# Patient Record
Sex: Female | Born: 1990 | Race: Asian | Hispanic: No | Marital: Single | Smoking: Never smoker
Health system: Southern US, Community
[De-identification: ages and names within clinical notes are randomized; demographics above are authoritative.]

## PROBLEM LIST (undated history)

## (undated) ENCOUNTER — Inpatient Hospital Stay (HOSPITAL_COMMUNITY): Payer: Self-pay

---
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
## 2010-09-09 ENCOUNTER — Encounter
Admission: RE | Admit: 2010-09-09 | Discharge: 2010-09-09 | Payer: Self-pay | Source: Home / Self Care | Attending: Family Medicine | Admitting: Family Medicine

## 2011-02-05 ENCOUNTER — Other Ambulatory Visit (HOSPITAL_COMMUNITY): Payer: Self-pay | Admitting: Obstetrics & Gynecology

## 2011-02-05 ENCOUNTER — Inpatient Hospital Stay (HOSPITAL_COMMUNITY)
Admission: AD | Admit: 2011-02-05 | Discharge: 2011-02-05 | Disposition: A | Discharge status: Home / Self Care | Payer: Self-pay | Source: Ambulatory Visit | Attending: Family Medicine | Admitting: Family Medicine

## 2011-02-05 ENCOUNTER — Ambulatory Visit (HOSPITAL_COMMUNITY)
Admission: RE | Admit: 2011-02-05 | Discharge: 2011-02-05 | Disposition: A | Discharge status: Home / Self Care | Payer: Self-pay | Source: Ambulatory Visit | Attending: Obstetrics & Gynecology | Admitting: Obstetrics & Gynecology

## 2011-02-05 ENCOUNTER — Encounter (HOSPITAL_COMMUNITY): Payer: Self-pay

## 2011-02-05 DIAGNOSIS — R58 Hemorrhage, not elsewhere classified: Secondary | ICD-10-CM

## 2011-02-05 DIAGNOSIS — Z3689 Encounter for other specified antenatal screening: Secondary | ICD-10-CM | POA: Insufficient documentation

## 2011-02-05 DIAGNOSIS — O209 Hemorrhage in early pregnancy, unspecified: Secondary | ICD-10-CM

## 2011-02-05 LAB — CBC
MCH: 30.7 pg (ref 26.0–34.0)
MCHC: 32.7 g/dL (ref 30.0–36.0)
MCV: 93.8 fL (ref 78.0–100.0)
Platelets: 265 10*3/uL (ref 150–400)
RDW: 13 % (ref 11.5–15.5)
WBC: 6.4 10*3/uL (ref 4.0–10.5)

## 2011-02-05 LAB — WET PREP, GENITAL: Trich, Wet Prep: NONE SEEN

## 2011-02-06 LAB — GC/CHLAMYDIA PROBE AMP, GENITAL
Chlamydia, DNA Probe: NEGATIVE
GC Probe Amp, Genital: NEGATIVE

## 2011-02-08 ENCOUNTER — Inpatient Hospital Stay (HOSPITAL_COMMUNITY)
Admission: AD | Admit: 2011-02-08 | Discharge: 2011-02-08 | Disposition: A | Discharge status: Home / Self Care | Payer: Self-pay | Source: Ambulatory Visit | Attending: Obstetrics & Gynecology | Admitting: Obstetrics & Gynecology

## 2011-02-08 DIAGNOSIS — O039 Complete or unspecified spontaneous abortion without complication: Secondary | ICD-10-CM

## 2011-02-08 LAB — ABO/RH: ABO/RH(D): O POS

## 2011-02-08 LAB — HCG, QUANTITATIVE, PREGNANCY: hCG, Beta Chain, Quant, S: 500 m[IU]/mL — ABNORMAL HIGH (ref <5)

## 2011-02-19 DEATH — deceased

## 2011-03-03 ENCOUNTER — Encounter: Payer: Self-pay | Admitting: Obstetrics and Gynecology

## 2012-06-10 ENCOUNTER — Ambulatory Visit (INDEPENDENT_AMBULATORY_CARE_PROVIDER_SITE_OTHER): Payer: BC Managed Care – PPO | Admitting: Emergency Medicine

## 2012-06-10 ENCOUNTER — Ambulatory Visit: Payer: BC Managed Care – PPO

## 2012-06-10 VITALS — BP 100/72 | HR 78 | Temp 98.5°F | Resp 16 | Ht 60.75 in | Wt 130.2 lb

## 2012-06-10 DIAGNOSIS — Z043 Encounter for examination and observation following other accident: Secondary | ICD-10-CM

## 2012-06-10 DIAGNOSIS — M25512 Pain in left shoulder: Secondary | ICD-10-CM

## 2012-06-10 DIAGNOSIS — T07XXXA Unspecified multiple injuries, initial encounter: Secondary | ICD-10-CM

## 2012-06-10 DIAGNOSIS — R0789 Other chest pain: Secondary | ICD-10-CM

## 2012-06-10 DIAGNOSIS — M542 Cervicalgia: Secondary | ICD-10-CM

## 2012-06-10 DIAGNOSIS — M25561 Pain in right knee: Secondary | ICD-10-CM

## 2012-06-10 DIAGNOSIS — M25562 Pain in left knee: Secondary | ICD-10-CM

## 2012-06-10 MED ORDER — MELOXICAM 7.5 MG PO TABS: 7.5000 mg | ORAL_TABLET | Freq: Every day | ORAL | Status: DC

## 2012-06-10 MED ORDER — METHOCARBAMOL 750 MG PO TABS: 750.0000 mg | ORAL_TABLET | Freq: Three times a day (TID) | ORAL | Status: DC

## 2012-06-10 NOTE — Progress Notes (Deleted)
  Subjective:    Patient ID: Kelsey Lara, female    DOB: December 17, 1990, 21 y.o.   MRN: [id number]  HPI    Review of Systems     Objective:   Physical Exam        Assessment & Plan:

## 2012-06-10 NOTE — Patient Instructions (Addendum)
Cervical Strain Care After A cervical strain is when the muscles and ligaments in your neck have been stretched. The bones are not broken. If you had any problems moving your arms or legs immediately after the injury, even if the problem has gone away, make sure to tell this to your caregiver.  HOME CARE INSTRUCTIONS   While awake, apply ice packs to the neck or areas of pain about every 1 to 2 hours, for 15 to 20 minutes at a time. Do this for 2 days. If you were given a cervical collar for support, ask your caregiver if you may remove it for bathing or applying ice.   If given a cervical collar, wear as instructed. Do not remove any collar unless instructed by a caregiver.   Only take over-the-counter or prescription medicines for pain, discomfort, or fever as directed by your caregiver.  Recheck with the hospital or clinic after a radiologist has read your X-rays. Recheck with the hospital or clinic to make sure the initial readings are correct. Do this also to determine if you need further studies. It is your responsibility to find out your X-ray results. X-rays are sometimes repeated in one week to ten days. These are often repeated to make sure that a hairline fracture was not overlooked. Ask your caregiver how you are to find out about your radiology (X-ray) results. SEEK IMMEDIATE MEDICAL CARE IF:   You have increasing pain in your neck.   You develop difficulties swallowing or breathing.   You have numbness, weakness, or movement problems in the arms or legs.   You have difficulty walking.   You develop bowel or bladder retention or incontinence.   You have problems with walking.  MAKE SURE YOU:   Understand these instructions.   Will watch your condition.   Will get help right away if you are not doing well or get worse.  Document Released: 09/06/2005 Document Revised: 05/19/2011 Document Reviewed: 04/19/2008 Exit[employer] Patient Information 2012 [employer]. 
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
## 2012-06-10 NOTE — Progress Notes (Signed)
  Subjective:    Patient ID: Kelsey Lara, female    DOB: 12/18/1990, 21 y.o.   MRN: [id number]  HPI    Review of SystemsNot pregnant. On last day of menses.     Objective:   Physical Exam  Constitutional: She is oriented to person, place, and time. She appears well-developed.  HENT:  Head: Normocephalic and atraumatic.  Eyes: Pupils are equal, round, and reactive to light.       Right eye is raised slightly higher then left  Neck:       Tender l paracervical  Muscles.Tender left suprascapular muscle.  Cardiovascular: Normal rate.   Pulmonary/Chest: Effort normal and breath sounds normal.  Abdominal: Soft. There is no tenderness. There is no guarding.  Musculoskeletal:       Pain with left shoulder external rotation. Bruises over both kneecaps  Neurological: She is alert and oriented to person, place, and time. She displays normal reflexes. No cranial nerve deficit. She exhibits normal muscle tone.  Skin: Skin is warm and dry.  Psychiatric: She has a normal mood and affect. Her behavior is normal. Thought content normal.  C spine normal CXR scoliosis Knees No fracture L Shoulder normal     Assessment & Plan:  Robaxin 750 Mobic. Recheck one week if not better

## 2012-06-13 ENCOUNTER — Encounter (HOSPITAL_COMMUNITY): Payer: Self-pay | Admitting: *Deleted

## 2012-06-13 ENCOUNTER — Emergency Department (HOSPITAL_COMMUNITY)
Admission: EM | Admit: 2012-06-13 | Discharge: 2012-06-13 | Disposition: A | Discharge status: Home / Self Care | Payer: No Typology Code available for payment source | Attending: Emergency Medicine | Admitting: Emergency Medicine

## 2012-06-13 ENCOUNTER — Emergency Department (HOSPITAL_COMMUNITY): Payer: No Typology Code available for payment source

## 2012-06-13 DIAGNOSIS — M7918 Myalgia, other site: Secondary | ICD-10-CM

## 2012-06-13 DIAGNOSIS — IMO0001 Reserved for inherently not codable concepts without codable children: Secondary | ICD-10-CM | POA: Insufficient documentation

## 2012-06-13 MED ORDER — TRAMADOL HCL 50 MG PO TABS: 50.0000 mg | ORAL_TABLET | Freq: Four times a day (QID) | ORAL | Status: DC | PRN
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
## 2012-06-14 NOTE — ED Provider Notes (Signed)
Medical screening examination/treatment/procedure(s) were performed by non-physician practitioner and as supervising physician I was immediately available for consultation/collaboration.   Celene Kras, MD 06/14/12 [phone]

## 2012-07-04 ENCOUNTER — Telehealth: Payer: Self-pay

## 2012-07-04 NOTE — Telephone Encounter (Signed)
Pt is calling for a copy of xrays of neck and shoulder done on 06/10/12 Please call pt when they are ready

## 2012-07-05 NOTE — Telephone Encounter (Signed)
Have given to xray they will call patient when ready.

## 2013-07-20 ENCOUNTER — Inpatient Hospital Stay (HOSPITAL_COMMUNITY)
Admission: AD | Admit: 2013-07-20 | Discharge: 2013-07-20 | Disposition: A | Discharge status: Home / Self Care | Payer: No Typology Code available for payment source | Source: Ambulatory Visit | Attending: Obstetrics & Gynecology | Admitting: Obstetrics & Gynecology

## 2013-07-20 ENCOUNTER — Encounter (HOSPITAL_COMMUNITY): Payer: Self-pay | Admitting: *Deleted

## 2013-07-20 ENCOUNTER — Inpatient Hospital Stay (HOSPITAL_COMMUNITY): Payer: Self-pay

## 2013-07-20 ENCOUNTER — Inpatient Hospital Stay (HOSPITAL_COMMUNITY): Payer: Medicaid Other

## 2013-07-20 DIAGNOSIS — Z3490 Encounter for supervision of normal pregnancy, unspecified, unspecified trimester: Secondary | ICD-10-CM

## 2013-07-20 DIAGNOSIS — O209 Hemorrhage in early pregnancy, unspecified: Secondary | ICD-10-CM

## 2013-07-20 LAB — URINE MICROSCOPIC-ADD ON

## 2013-07-20 LAB — CBC WITH DIFFERENTIAL/PLATELET
Basophils Absolute: 0 10*3/uL (ref 0.0–0.1)
Basophils Relative: 1 % (ref 0–1)
Eosinophils Absolute: 0.1 10*3/uL (ref 0.0–0.7)
Lymphs Abs: 1.3 10*3/uL (ref 0.7–4.0)
MCH: 30.9 pg (ref 26.0–34.0)
MCHC: 33.7 g/dL (ref 30.0–36.0)
Neutro Abs: 5.8 10*3/uL (ref 1.7–7.7)
Neutrophils Relative %: 74 % (ref 43–77)
Platelets: 243 10*3/uL (ref 150–400)
RDW: 12.9 % (ref 11.5–15.5)
WBC: 7.8 10*3/uL (ref 4.0–10.5)

## 2013-07-20 LAB — URINALYSIS, ROUTINE W REFLEX MICROSCOPIC
Ketones, ur: NEGATIVE mg/dL
Leukocytes, UA: NEGATIVE
Nitrite: NEGATIVE
Protein, ur: NEGATIVE mg/dL
Urobilinogen, UA: 0.2 mg/dL (ref 0.0–1.0)

## 2013-07-20 LAB — WET PREP, GENITAL
Trich, Wet Prep: NONE SEEN
Yeast Wet Prep HPF POC: NONE SEEN

## 2013-07-20 LAB — POCT PREGNANCY, URINE: Preg Test, Ur: POSITIVE — AB

## 2013-07-20 NOTE — MAU Provider Note (Signed)
History     CSN: [id number]  Arrival date and time: 07/20/13 1235   First Provider Initiated Contact with Patient 07/20/13 1310      Chief Complaint  Patient presents with  . Vaginal Bleeding   HPI Kelsey Lara is 22 y.o. G4P1011 [redacted]w[redacted]d weeks presenting with vaginal bleeding that began last night.  Described as bright red, spotty X 30 minutes.  No further bleeding today but saw brown discharge on tissue.  Denies cramping.  Denies nausea and vomiting. Denies UTI sxs.   Has not decided where she will get prenatal care.  Last intercourse 2 days ago.     History reviewed. No pertinent past medical history.  History reviewed. No pertinent past surgical history.  History reviewed. No pertinent family history.  History  Substance Use Topics  . Smoking status: Never Smoker   . Smokeless tobacco: Never Used  . Alcohol Use: No    Allergies: No Known Allergies  No prescriptions prior to admission    Review of Systems  Constitutional: Negative for fever and chills.  Gastrointestinal: Negative for nausea, vomiting, abdominal pain, diarrhea and constipation.  Genitourinary: Negative for dysuria, urgency and frequency.       + for vaginal bleeding  Neurological: Negative for headaches.   Physical Exam   Blood pressure 105/59, pulse 89, temperature 98.2 F (36.8 C), temperature source Oral, resp. rate 16, height 5\' 1"  (1.549 m), weight 125 lb (56.7 kg), last menstrual period 06/06/2013, SpO2 100.00%.  Physical Exam  Constitutional: She is oriented to person, place, and time. She appears well-developed and well-nourished. No distress.  HENT:  Head: Normocephalic.  Neck: Normal range of motion.  Cardiovascular: Normal rate.   Respiratory: Effort normal.  GI: Soft. She exhibits no distension and no mass. There is no tenderness. There is no rebound and no guarding.  Genitourinary: There is no rash, tenderness or lesion on the right labia. There is no rash, tenderness or lesion on the  left labia. Uterus is enlarged. Uterus is not tender. Cervix exhibits no motion tenderness and no friability. Right adnexum displays no mass, no tenderness and no fullness. Left adnexum displays no mass, no tenderness and no fullness. No bleeding (neg for active bleeding) around the vagina. Vaginal discharge (dark brown discharge-mod amount) found.  Neurological: She is alert and oriented to person, place, and time.  Skin: Skin is warm and dry.  Psychiatric: She has a normal mood and affect. Her behavior is normal.   Results for orders placed during the hospital encounter of 07/20/13 (from the past 24 hour(s))  URINALYSIS, ROUTINE W REFLEX MICROSCOPIC     Status: Abnormal   Collection Time    07/20/13 12:42 PM      Result Value Range   Color, Urine YELLOW  YELLOW   APPearance HAZY (*) CLEAR   Specific Gravity, Urine 1.020  1.005 - 1.030   pH 7.5  5.0 - 8.0   Glucose, UA NEGATIVE  NEGATIVE mg/dL   Hgb urine dipstick SMALL (*) NEGATIVE   Bilirubin Urine NEGATIVE  NEGATIVE   Ketones, ur NEGATIVE  NEGATIVE mg/dL   Protein, ur NEGATIVE  NEGATIVE mg/dL   Urobilinogen, UA 0.2  0.0 - 1.0 mg/dL   Nitrite NEGATIVE  NEGATIVE   Leukocytes, UA NEGATIVE  NEGATIVE  URINE MICROSCOPIC-ADD ON     Status: Abnormal   Collection Time    07/20/13 12:42 PM      Result Value Range   Squamous Epithelial / LPF RARE  RARE  WBC, UA 0-2  <3 WBC/hpf   RBC / HPF 0-2  <3 RBC/hpf   Bacteria, UA FEW (*) RARE   Urine-Other AMORPHOUS URATES/PHOSPHATES    POCT PREGNANCY, URINE     Status: Abnormal   Collection Time    07/20/13  1:03 PM      Result Value Range   Preg Test, Ur POSITIVE (*) NEGATIVE  CBC WITH DIFFERENTIAL     Status: None   Collection Time    07/20/13  1:23 PM      Result Value Range   WBC 7.8  4.0 - 10.5 K/uL   RBC 4.05  3.87 - 5.11 MIL/uL   Hemoglobin 12.5  12.0 - 15.0 g/dL   HCT 16.1  09.6 - 04.5 %   MCV 91.6  78.0 - 100.0 fL   MCH 30.9  26.0 - 34.0 pg   MCHC 33.7  30.0 - 36.0 g/dL   RDW  40.9  81.1 - 91.4 %   Platelets 243  150 - 400 K/uL   Neutrophils Relative % 74  43 - 77 %   Neutro Abs 5.8  1.7 - 7.7 K/uL   Lymphocytes Relative 17  12 - 46 %   Lymphs Abs 1.3  0.7 - 4.0 K/uL   Monocytes Relative 7  3 - 12 %   Monocytes Absolute 0.6  0.1 - 1.0 K/uL   Eosinophils Relative 1  0 - 5 %   Eosinophils Absolute 0.1  0.0 - 0.7 K/uL   Basophils Relative 1  0 - 1 %   Basophils Absolute 0.0  0.0 - 0.1 K/uL  HCG, QUANTITATIVE, PREGNANCY     Status: Abnormal   Collection Time    07/20/13  1:23 PM      Result Value Range   hCG, Beta Chain, Quant, S [id number] (*) <5 mIU/mL  WET PREP, GENITAL     Status: Abnormal   Collection Time    07/20/13  1:30 PM      Result Value Range   Yeast Wet Prep HPF POC NONE SEEN  NONE SEEN   Trich, Wet Prep NONE SEEN  NONE SEEN   Clue Cells Wet Prep HPF POC NONE SEEN  NONE SEEN   WBC, Wet Prep HPF POC MANY (*) NONE SEEN    US Ob Comp Less 14 Wks  07/20/2013   CLINICAL DATA:  Early pregnancy. Bleeding  EXAM: OBSTETRIC <14 WK ULTRASOUND  TECHNIQUE: Transabdominal ultrasound was performed for evaluation of the gestation as well as the maternal uterus and adnexal regions.  COMPARISON:  02/05/2011  FINDINGS: Intrauterine gestational sac: Visualized/normal in shape.  Yolk sac:  Yes  Embryo:  Yes  Cardiac Activity: Yes  Heart Rate: 126 bpm  MSD:   mm    w     d  CRL:   7.5  mm   6 w 5 d                  [place] EDC: 03/10/2014  Maternal uterus/adnexae:  Subchorionic hemorrhage: None  Right ovary: Normal  Left ovary: Normal  Other :None  Free fluid: None  IMPRESSION: 1. Single living intrauterine gestation with an estimated gestational age of [redacted] weeks and 5 days.   Electronically Signed   By: Signa Kell M.D.   On: 07/20/2013 14:36  MAU Course  Procedures  GC/CHL cultures to lab  MDM  Assessment and Plan  A:  Viable intrauterine pregnancy at [redacted]w[redacted]d gestation  Vaginal bleeding in early pregnancy  P:  Begin prenatal care with MD of choice       Pelvic rest  until bleeding stops      Return for worsening sxs  Jevon Littlepage,EVE M 07/20/2013, 3:25 PM

## 2013-07-20 NOTE — MAU Provider Note (Signed)
Attestation of Attending Supervision of Advanced Practitioner (CNM/NP): Evaluation and management procedures were performed by the Advanced Practitioner under my supervision and collaboration.  I have reviewed the Advanced Practitioner's note and chart, and I agree with the management and plan.  HARRAWAY-SMITH, Ramona Slinger 3:32 PM

## 2013-07-20 NOTE — MAU Note (Signed)
Patient presents to MAU with c/o bright red spotting yesterday; noticed brown spotting this morning. No bleeding now. Denies pain.

## 2013-07-21 LAB — GC/CHLAMYDIA PROBE AMP: CT Probe RNA: NEGATIVE

## 2013-09-20 NOTE — L&D Delivery Note (Signed)
Patient was C/C/+2 and pushed for 0 minutes with epidural.   NSVD  female infant, Apgars 8,9, weight P.   The patient had no lacerations . Fundus was firm. EBL was expected at 400 cc but methergine given for some atony. Placenta was delivered intact. Vagina was clear.  Baby was vigorous and doing skin to skin with mother.  HORVATH,MICHELLE A

## 2013-11-02 LAB — OB RESULTS CONSOLE ABO/RH: RH Type: POSITIVE

## 2013-11-02 LAB — OB RESULTS CONSOLE HEPATITIS B SURFACE ANTIGEN: Hepatitis B Surface Ag: NEGATIVE

## 2013-11-02 LAB — OB RESULTS CONSOLE GC/CHLAMYDIA
Chlamydia: NEGATIVE
Gonorrhea: NEGATIVE

## 2013-11-02 LAB — OB RESULTS CONSOLE RPR: RPR: NONREACTIVE

## 2013-11-02 LAB — OB RESULTS CONSOLE ANTIBODY SCREEN: Antibody Screen: NEGATIVE

## 2013-11-02 LAB — OB RESULTS CONSOLE HIV ANTIBODY (ROUTINE TESTING): HIV: NONREACTIVE

## 2013-11-02 LAB — OB RESULTS CONSOLE RUBELLA ANTIBODY, IGM: Rubella: IMMUNE

## 2014-02-15 LAB — OB RESULTS CONSOLE GBS: GBS: NEGATIVE

## 2014-03-14 ENCOUNTER — Telehealth (HOSPITAL_COMMUNITY): Payer: Self-pay | Admitting: *Deleted

## 2014-03-14 ENCOUNTER — Encounter (HOSPITAL_COMMUNITY): Payer: Self-pay | Admitting: *Deleted

## 2014-03-14 NOTE — Telephone Encounter (Signed)
Preadmission screen  

## 2014-03-15 ENCOUNTER — Inpatient Hospital Stay (HOSPITAL_COMMUNITY)
Admission: RE | Admit: 2014-03-15 | Discharge: 2014-03-16 | DRG: 775 | Disposition: A | Discharge status: Home / Self Care | Payer: Medicaid Other | Source: Ambulatory Visit | Attending: Obstetrics and Gynecology | Admitting: Obstetrics and Gynecology

## 2014-03-15 ENCOUNTER — Encounter (HOSPITAL_COMMUNITY): Payer: Self-pay

## 2014-03-15 DIAGNOSIS — Z3490 Encounter for supervision of normal pregnancy, unspecified, unspecified trimester: Secondary | ICD-10-CM

## 2014-03-15 DIAGNOSIS — O480 Post-term pregnancy: Principal | ICD-10-CM | POA: Diagnosis present

## 2014-03-15 LAB — CBC
HCT: 39.4 % (ref 36.0–46.0)
HEMOGLOBIN: 13.2 g/dL (ref 12.0–15.0)
MCH: 31.7 pg (ref 26.0–34.0)
MCHC: 33.5 g/dL (ref 30.0–36.0)
MCV: 94.5 fL (ref 78.0–100.0)
Platelets: 218 10*3/uL (ref 150–400)
RBC: 4.17 MIL/uL (ref 3.87–5.11)
RDW: 14.2 % (ref 11.5–15.5)
WBC: 7.7 10*3/uL (ref 4.0–10.5)

## 2014-03-15 LAB — TYPE AND SCREEN
ABO/RH(D): O POS
Antibody Screen: NEGATIVE

## 2014-03-15 LAB — RPR

## 2014-03-15 MED ORDER — PHENYLEPHRINE 40 MCG/ML (10ML) SYRINGE FOR IV PUSH (FOR BLOOD PRESSURE SUPPORT)
80.0000 ug | PREFILLED_SYRINGE | INTRAVENOUS | Status: DC | PRN
  Filled 2014-03-15: qty 2

## 2014-03-15 MED ORDER — OXYTOCIN BOLUS FROM INFUSION: 500.0000 mL | INTRAVENOUS | Status: DC

## 2014-03-15 MED ORDER — MEASLES, MUMPS & RUBELLA VAC ~~LOC~~ INJ: 0.5000 mL | INJECTION | Freq: Once | SUBCUTANEOUS | Status: DC

## 2014-03-15 MED ORDER — LACTATED RINGERS IV SOLN: 500.0000 mL | Freq: Once | INTRAVENOUS | Status: DC

## 2014-03-15 MED ORDER — FERROUS SULFATE 325 (65 FE) MG PO TABS
325.0000 mg | ORAL_TABLET | Freq: Two times a day (BID) | ORAL | Status: DC
  Administered 2014-03-16 (×2): 325 mg via ORAL
  Filled 2014-03-15 (×2): qty 1

## 2014-03-15 MED ORDER — DIPHENHYDRAMINE HCL 25 MG PO CAPS: 25.0000 mg | ORAL_CAPSULE | Freq: Four times a day (QID) | ORAL | Status: DC | PRN

## 2014-03-15 MED ORDER — METHYLERGONOVINE MALEATE 0.2 MG PO TABS: 0.2000 mg | ORAL_TABLET | ORAL | Status: DC | PRN

## 2014-03-15 MED ORDER — ACETAMINOPHEN 325 MG PO TABS: 650.0000 mg | ORAL_TABLET | ORAL | Status: DC | PRN

## 2014-03-15 MED ORDER — OXYCODONE-ACETAMINOPHEN 5-325 MG PO TABS: 1.0000 | ORAL_TABLET | ORAL | Status: DC | PRN

## 2014-03-15 MED ORDER — METHYLERGONOVINE MALEATE 0.2 MG/ML IJ SOLN: 0.2000 mg | INTRAMUSCULAR | Status: DC | PRN

## 2014-03-15 MED ORDER — ONDANSETRON HCL 4 MG/2ML IJ SOLN: 4.0000 mg | INTRAMUSCULAR | Status: DC | PRN

## 2014-03-15 MED ORDER — LACTATED RINGERS IV SOLN
INTRAVENOUS | Status: DC
  Administered 2014-03-15: 1000 mL via INTRAVENOUS

## 2014-03-15 MED ORDER — TERBUTALINE SULFATE 1 MG/ML IJ SOLN: 0.2500 mg | Freq: Once | INTRAMUSCULAR | Status: DC | PRN

## 2014-03-15 MED ORDER — METHYLERGONOVINE MALEATE 0.2 MG/ML IJ SOLN
0.2000 mg | Freq: Once | INTRAMUSCULAR | Status: AC
  Administered 2014-03-15: 0.2 mg via INTRAMUSCULAR

## 2014-03-15 MED ORDER — BENZOCAINE-MENTHOL 20-0.5 % EX AERO: 1.0000 "application " | INHALATION_SPRAY | CUTANEOUS | Status: DC | PRN

## 2014-03-15 MED ORDER — MAGNESIUM HYDROXIDE 400 MG/5ML PO SUSP: 30.0000 mL | ORAL | Status: DC | PRN

## 2014-03-15 MED ORDER — IBUPROFEN 800 MG PO TABS
800.0000 mg | ORAL_TABLET | Freq: Three times a day (TID) | ORAL | Status: DC
  Administered 2014-03-16 (×3): 800 mg via ORAL
  Filled 2014-03-15 (×3): qty 1

## 2014-03-15 MED ORDER — SODIUM CHLORIDE 0.9 % IV SOLN: 250.0000 mL | INTRAVENOUS | Status: DC | PRN

## 2014-03-15 MED ORDER — ZOLPIDEM TARTRATE 5 MG PO TABS: 5.0000 mg | ORAL_TABLET | Freq: Every evening | ORAL | Status: DC | PRN

## 2014-03-15 MED ORDER — METHYLERGONOVINE MALEATE 0.2 MG/ML IJ SOLN
INTRAMUSCULAR | Status: AC
  Filled 2014-03-15: qty 1

## 2014-03-15 MED ORDER — WITCH HAZEL-GLYCERIN EX PADS: 1.0000 "application " | MEDICATED_PAD | CUTANEOUS | Status: DC | PRN

## 2014-03-15 MED ORDER — ONDANSETRON HCL 4 MG/2ML IJ SOLN: 4.0000 mg | Freq: Four times a day (QID) | INTRAMUSCULAR | Status: DC | PRN

## 2014-03-15 MED ORDER — LANOLIN HYDROUS EX OINT: TOPICAL_OINTMENT | CUTANEOUS | Status: DC | PRN

## 2014-03-15 MED ORDER — BUTORPHANOL TARTRATE 1 MG/ML IJ SOLN
1.0000 mg | INTRAMUSCULAR | Status: DC | PRN
  Administered 2014-03-15: 1 mg via INTRAVENOUS
  Filled 2014-03-15: qty 1

## 2014-03-15 MED ORDER — EPHEDRINE 5 MG/ML INJ
10.0000 mg | INTRAVENOUS | Status: DC | PRN
  Filled 2014-03-15: qty 2

## 2014-03-15 MED ORDER — SODIUM CHLORIDE 0.9 % IJ SOLN: 3.0000 mL | Freq: Two times a day (BID) | INTRAMUSCULAR | Status: DC

## 2014-03-15 MED ORDER — SODIUM CHLORIDE 0.9 % IJ SOLN: 3.0000 mL | INTRAMUSCULAR | Status: DC | PRN

## 2014-03-15 MED ORDER — SENNOSIDES-DOCUSATE SODIUM 8.6-50 MG PO TABS
2.0000 | ORAL_TABLET | ORAL | Status: DC
  Administered 2014-03-16: 2 via ORAL
  Filled 2014-03-15: qty 2

## 2014-03-15 MED ORDER — LIDOCAINE HCL (PF) 1 % IJ SOLN
30.0000 mL | INTRAMUSCULAR | Status: DC | PRN
  Filled 2014-03-15: qty 30

## 2014-03-15 MED ORDER — OXYTOCIN 40 UNITS IN LACTATED RINGERS INFUSION - SIMPLE MED: 62.5000 mL/h | INTRAVENOUS | Status: DC

## 2014-03-15 MED ORDER — PRENATAL MULTIVITAMIN CH
1.0000 | ORAL_TABLET | Freq: Every day | ORAL | Status: DC
  Administered 2014-03-16: 1 via ORAL
  Filled 2014-03-15: qty 1

## 2014-03-15 MED ORDER — FENTANYL 2.5 MCG/ML BUPIVACAINE 1/10 % EPIDURAL INFUSION (WH - ANES): 14.0000 mL/h | INTRAMUSCULAR | Status: DC | PRN

## 2014-03-15 MED ORDER — FLEET ENEMA 7-19 GM/118ML RE ENEM: 1.0000 | ENEMA | RECTAL | Status: DC | PRN

## 2014-03-15 MED ORDER — DIPHENHYDRAMINE HCL 50 MG/ML IJ SOLN: 12.5000 mg | INTRAMUSCULAR | Status: DC | PRN

## 2014-03-15 MED ORDER — LACTATED RINGERS IV SOLN: 500.0000 mL | INTRAVENOUS | Status: DC | PRN

## 2014-03-15 MED ORDER — TETANUS-DIPHTH-ACELL PERTUSSIS 5-2.5-18.5 LF-MCG/0.5 IM SUSP
0.5000 mL | Freq: Once | INTRAMUSCULAR | Status: AC
  Administered 2014-03-16: 0.5 mL via INTRAMUSCULAR
  Filled 2014-03-15: qty 0.5

## 2014-03-15 MED ORDER — ONDANSETRON HCL 4 MG PO TABS: 4.0000 mg | ORAL_TABLET | ORAL | Status: DC | PRN

## 2014-03-15 MED ORDER — SIMETHICONE 80 MG PO CHEW: 80.0000 mg | CHEWABLE_TABLET | ORAL | Status: DC | PRN

## 2014-03-15 MED ORDER — IBUPROFEN 600 MG PO TABS: 600.0000 mg | ORAL_TABLET | Freq: Four times a day (QID) | ORAL | Status: DC | PRN

## 2014-03-15 MED ORDER — CITRIC ACID-SODIUM CITRATE 334-500 MG/5ML PO SOLN: 30.0000 mL | ORAL | Status: DC | PRN

## 2014-03-15 MED ORDER — OXYTOCIN 40 UNITS IN LACTATED RINGERS INFUSION - SIMPLE MED
1.0000 m[IU]/min | INTRAVENOUS | Status: DC
Start: 2014-03-15 — End: 2014-03-15
  Administered 2014-03-15: 2 m[IU]/min via INTRAVENOUS
  Filled 2014-03-15: qty 1000

## 2014-03-15 MED ORDER — DIBUCAINE 1 % RE OINT: 1.0000 "application " | TOPICAL_OINTMENT | RECTAL | Status: DC | PRN
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
## 2014-03-15 NOTE — Progress Notes (Signed)
This note also relates to the following rows which could not be included: BP - Cannot attach notes to unvalidated device data    Dr Henderson CloudHorvath in room to evaluate bleeding. Orders methergine.

## 2014-03-15 NOTE — H&P (Signed)
23 y.o. 7638w2d  G4P1011 comes in for post term induction.  Otherwise has good fetal movement and no bleeding.  History reviewed. No pertinent past medical history. History reviewed. No pertinent past surgical history.  OB History  Gravida Para Term Preterm AB SAB TAB Ectopic Multiple Living  4 1 1  1 1    1     # Outcome Date GA Lbr Len/2nd Weight Sex Delivery Anes PTL Lv  4 CUR           3 TRM 09/26/07 8431w0d 06:00 3.204 kg (7 lb 1 oz) F SVD   Y  2 SAB           1 GRA              Comments: System Generated. Please review and update pregnancy details.      History   Social History  . Marital Status: Single    Spouse Name: N/A    Number of Children: N/A  . Years of Education: N/A   Occupational History  . Not on file.   Social History Main Topics  . Smoking status: Never Smoker   . Smokeless tobacco: Never Used  . Alcohol Use: No  . Drug Use: No  . Sexual Activity: Yes   Other Topics Concern  . Not on file   Social History Narrative  . No narrative on file   Review of patient's allergies indicates no known allergies.    Prenatal Transfer Tool  Maternal Diabetes: No Genetic Screening: Declined- late pnc Maternal Ultrasounds/Referrals: Normal Fetal Ultrasounds or other Referrals:  None Maternal Substance Abuse:  No Significant Maternal Medications:  None Significant Maternal Lab Results: None  Other PNC: uncomplicated.    Filed Vitals:   03/15/14 0715  BP: 110/80  Pulse: 94  Temp: 98.1 F (36.7 C)  Resp: 20     Lungs/Cor:  NAD Abdomen:  soft, gravid Ex:  no cords, erythema SVE:  4/70/-2, AROM clear FHTs:  120, good STV, NST R Toco:  q10   A/P   Post term induction.  GBS neg  HORVATH,MICHELLE A

## 2014-03-16 LAB — CBC
HEMATOCRIT: 32 % — AB (ref 36.0–46.0)
Hemoglobin: 10.6 g/dL — ABNORMAL LOW (ref 12.0–15.0)
MCH: 31.4 pg (ref 26.0–34.0)
MCHC: 33.4 g/dL (ref 30.0–36.0)
MCV: 93.8 fL (ref 78.0–100.0)
Platelets: 210 10*3/uL (ref 150–400)
RBC: 3.41 MIL/uL — ABNORMAL LOW (ref 3.87–5.11)
RDW: 14 % (ref 11.5–15.5)
WBC: 13.1 10*3/uL — ABNORMAL HIGH (ref 4.0–10.5)

## 2014-03-16 NOTE — Discharge Summary (Signed)
Obstetric Discharge Summary Reason for Admission: induction of labor Prenatal Procedures: none Intrapartum Procedures: spontaneous vaginal delivery Postpartum Procedures: none Complications-Operative and Postpartum: none Hemoglobin  Date Value Ref Range Status  03/16/2014 10.6* 12.0 - 15.0 g/dL Final     REPEATED TO VERIFY     DELTA CHECK NOTED     HCT  Date Value Ref Range Status  03/16/2014 32.0* 36.0 - 46.0 % Final   Discharge Diagnoses: Term Pregnancy-delivered  Discharge Information: Date: 03/16/2014 Activity: pelvic rest Diet: routine Medications: Ibuprofen Condition: stable Instructions: refer to practice specific booklet Discharge to: home Follow-up Information   Follow up with HORVATH,MICHELLE A, MD In 4 weeks.   Specialty:  Obstetrics and Gynecology   Contact information:   [address] [place] [id number] [phone]       Newborn Data: Live born female  Birth Weight: 8 lb 5 oz (3771 g) APGAR: 8, 9  Home with mother.  HORVATH,MICHELLE A 03/16/2014, 7:36 AM

## 2014-03-16 NOTE — Progress Notes (Signed)
Patient is eating, ambulating, voiding.  Pain control is good.  Filed Vitals:   03/15/14 2030 03/15/14 2145 03/16/14 0100 03/16/14 0635  BP: 110/71 98/59 99/66  92/57  Pulse: 76 81 75 79  Temp: 98.6 F (37 C) 98.6 F (37 C) 99 F (37.2 C) 97.9 F (36.6 C)  TempSrc: Oral Oral Oral Oral  Resp: 18 18 18 17   Height:      Weight:      SpO2: 100%   96%    Fundus firm Perineum without swelling.  Lab Results  Component Value Date   WBC 13.1* 03/16/2014   HGB 10.6* 03/16/2014   HCT 32.0* 03/16/2014   MCV 93.8 03/16/2014   PLT 210 03/16/2014    --/--/O POS (06/26 0730)/RI  A/P Post partum day 1.  Routine care.  Expect d/c routine.    HORVATH,MICHELLE A

## 2014-03-16 NOTE — Lactation Note (Addendum)
This note was copied from the chart of Kelsey Meklit Spellman. Lactation Consultation Note  Patient Name: Kelsey Lara ZOXWR'UToday's Date: 03/16/2014 Reason for consult: Initial assessment Per mom the baby fed several times after birth and since has been sleepy. Lc reviewed basics, and mentioned it is normal, LC changed wet and stool diaper. Reviewed hand expressing ( noted a steady flow of colostrum both breast ) , breast massage ,  Latched well both breast with assist for depth and positioning. Multiply swallows noted , increased with breast compressions. And baby sustained a deep latch on the right breast for 20 mins, left breast deep latch and still feeding when consult finished . [facility]BU RN aware. Per mom comfortable. LC reviewed sore nipple and engorgement prevention and tx . Per mom has a pump at home and is also active with WIC.  Mother informed of post-discharge support and given phone number to the lactation department, including services for phone call assistance; out-patient appointments; and breastfeeding support group. List of other breastfeeding resources in the community given in the handout. Encouraged mother to call for problems or concerns related to breastfeeding. Mom c/o tender nipples , no breakdown noted , [facility] LC encouraged mom to use her EBM liberally to her nipples and showed her to use reverse pressure prior to latch.    Maternal Data Has patient been taught Hand Expression?: Yes Does the patient have breastfeeding experience prior to this delivery?: Yes  Feeding Feeding Type: Breast Fed (left breast , cross cradle ) Length of feed: 20 min  LATCH Score/Interventions Latch: Grasps breast easily, tongue down, lips flanged, rhythmical sucking.  Audible Swallowing: Spontaneous and intermittent (multiply swallows , increased with breast compressions )  Type of Nipple: Everted at rest and after stimulation  Comfort (Breast/Nipple): Soft /  non-tender     Hold (Positioning): Assistance needed to correctly position infant at breast and maintain latch. Intervention(s): Breastfeeding basics reviewed;Support Pillows;Position options;Skin to skin  LATCH Score: 9  Lactation Tools Discussed/Used WIC Program: Yes (per mom , active )   Consult Status Consult Status: Follow-up Date: 03/17/14 Follow-up type: In-patient    Kathrin Greathouseorio, Margaret Ann 03/16/2014, 5:52 PM

## 2014-03-18 NOTE — Progress Notes (Signed)
Post discharge chart review completed.  

## 2014-05-11 IMAGING — CR DG KNEE 1-2V*L*
2 series · 2 of 2 positions shown · non-contrast
Comparison: None.

CLINICAL DATA: Knee pain

LEFT KNEE - 1-2 VIEW

[AP]
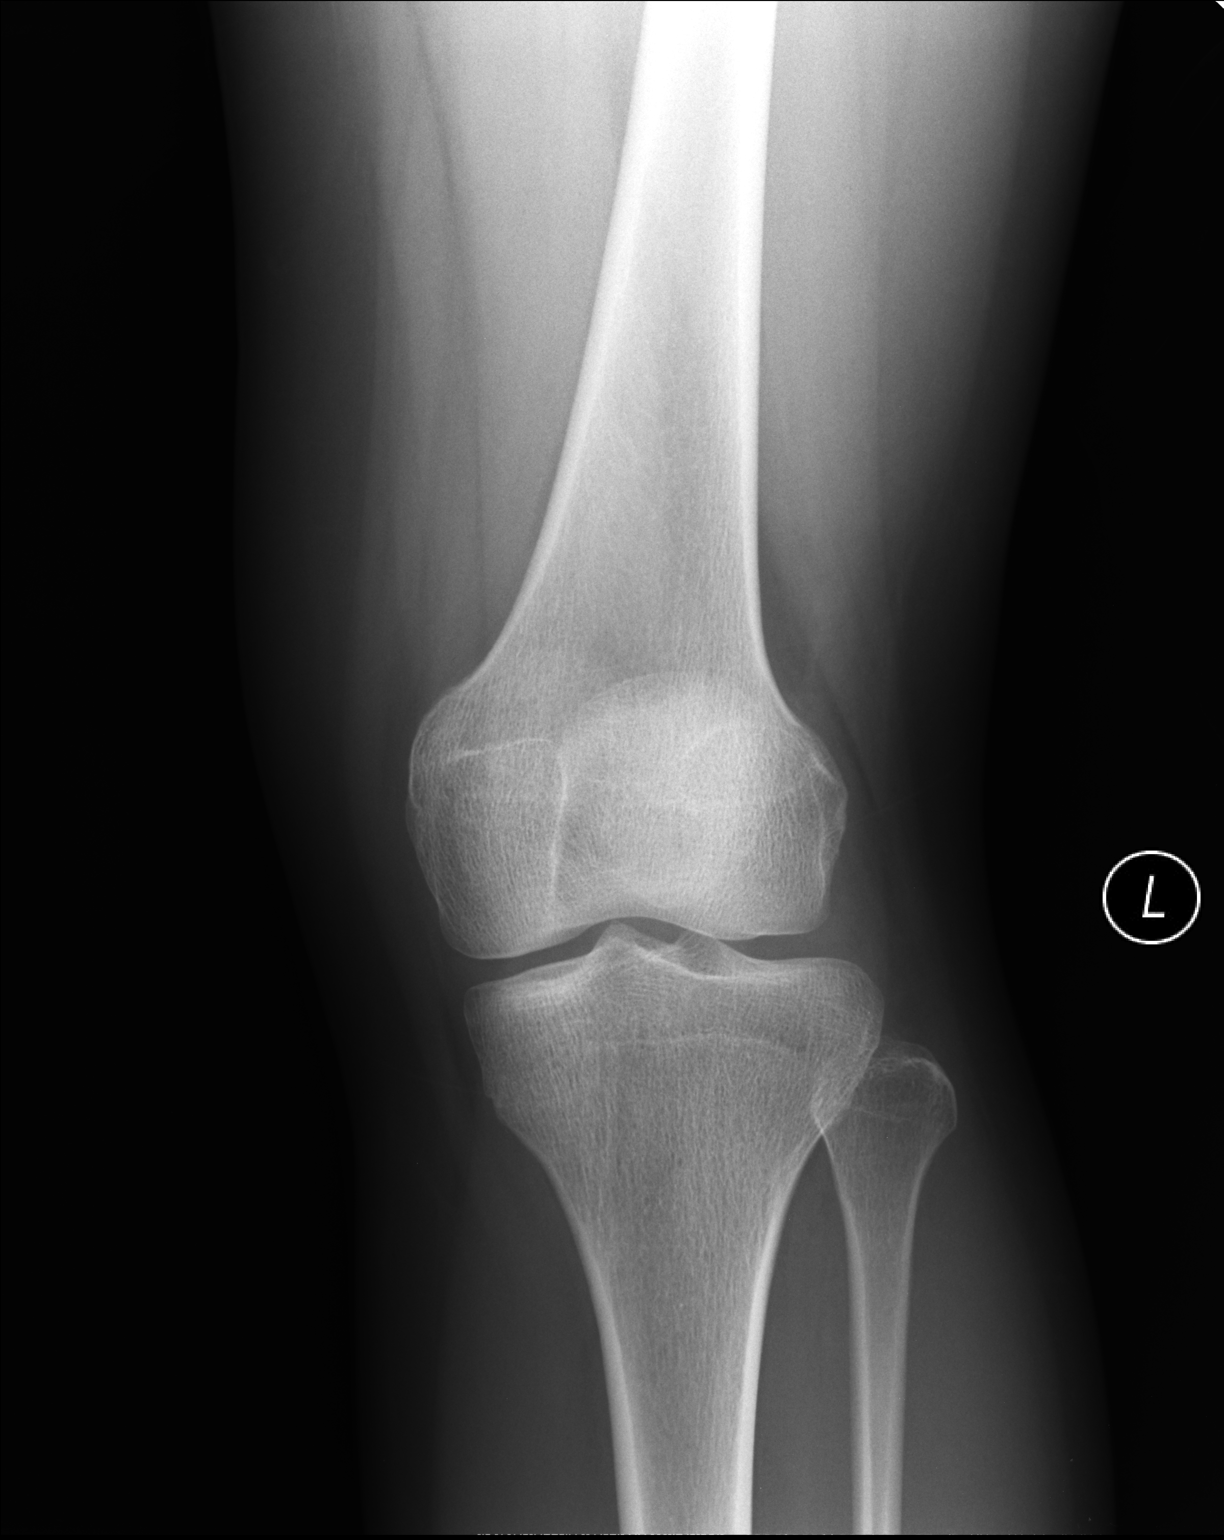

[lateral]
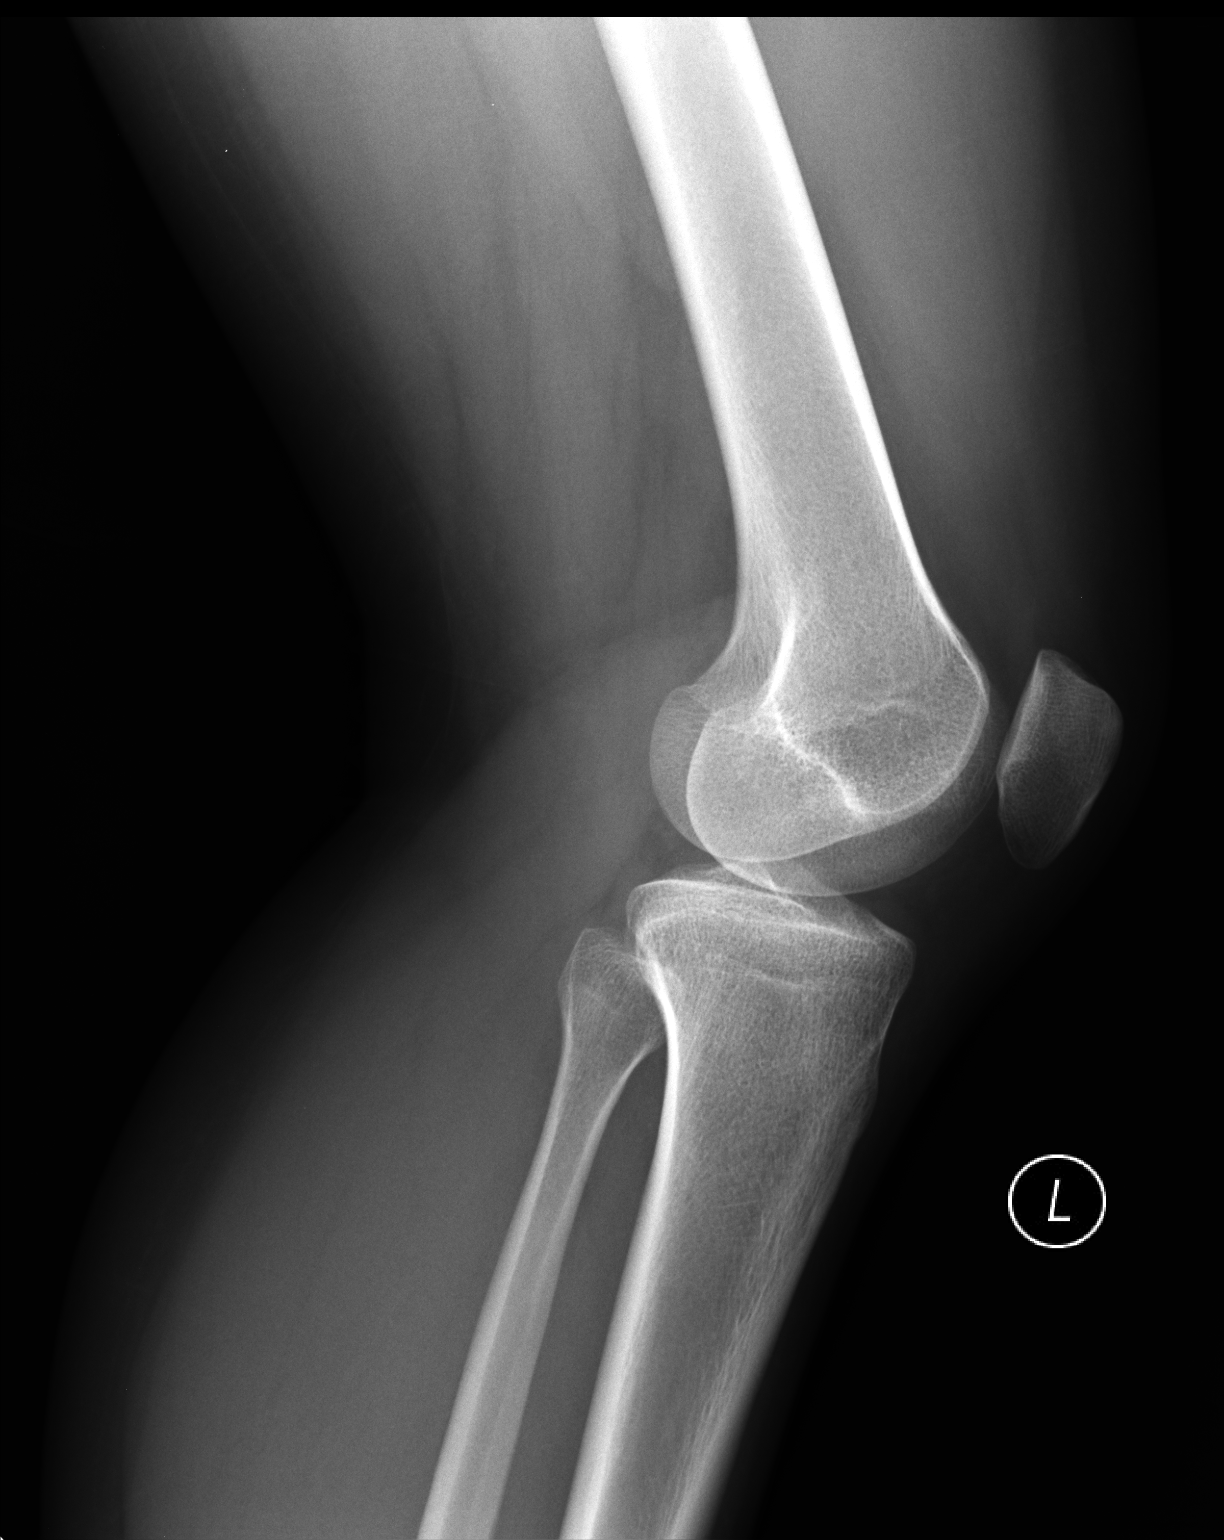

[2 of 2 positions shown; findings below may reference images not displayed]

FINDINGS: Two views of the left knee submitted.  No acute fracture
or subluxation.  No radiopaque foreign body.  No joint effusion.
IMPRESSION: No acute fracture or subluxation.
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
## 2014-07-22 ENCOUNTER — Encounter (HOSPITAL_COMMUNITY): Payer: Self-pay

## 2015-06-20 IMAGING — US US OB COMP LESS 14 WK
1 series · 14 of 28 positions shown · non-contrast
Comparison: 02/05/2011

CLINICAL DATA: Early pregnancy. Bleeding

EXAM:
OBSTETRIC <14 WK ULTRASOUND
TECHNIQUE: Transabdominal ultrasound was performed for evaluation of the
gestation as well as the maternal uterus and adnexal regions.

[Series 1: us ob comp less 14 wks · 14 of 47 slices shown]
[im 2/47]
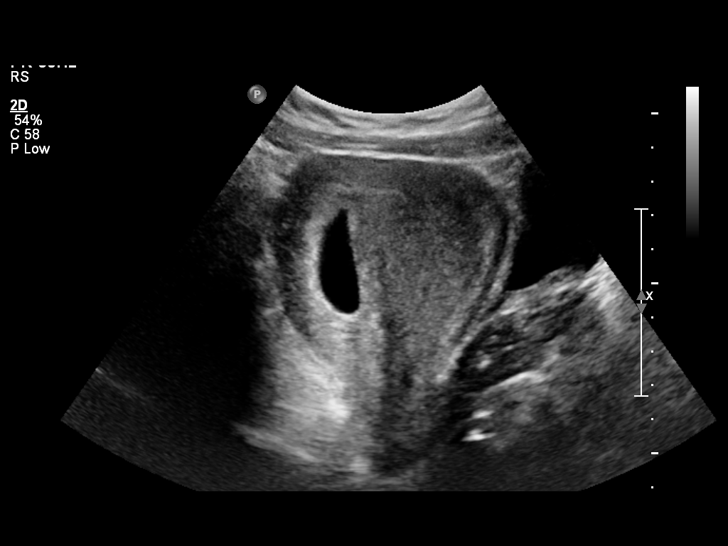
[im 6/47]
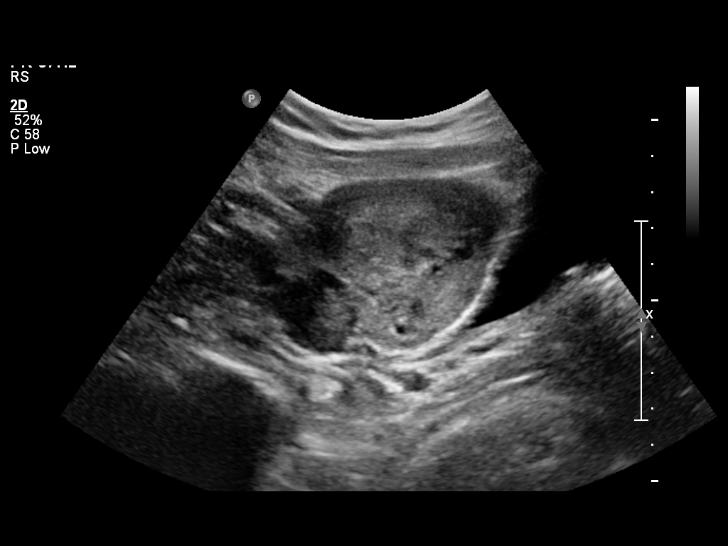
[im 9/47]
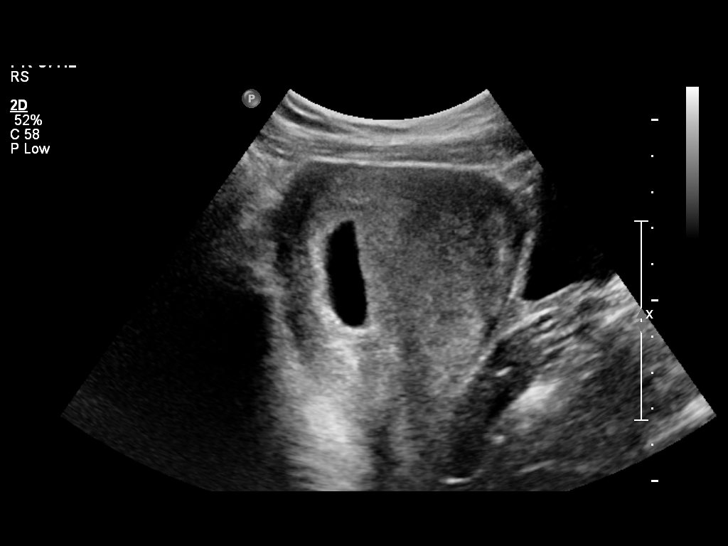
[im 12/47]
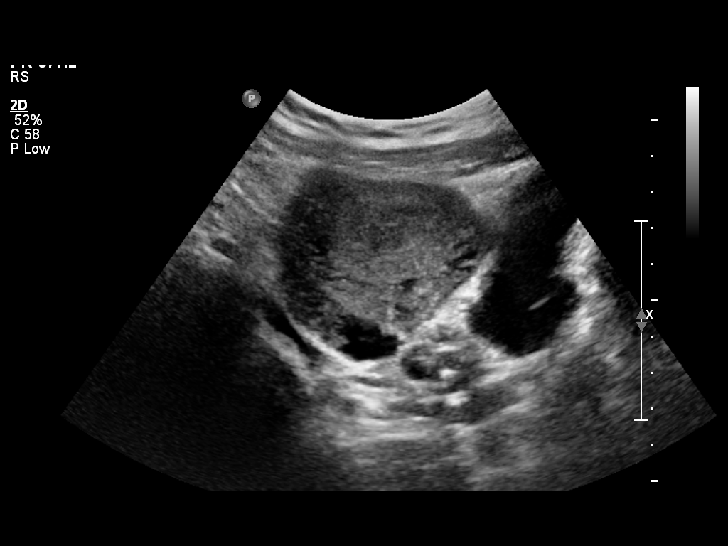
[im 16/47]
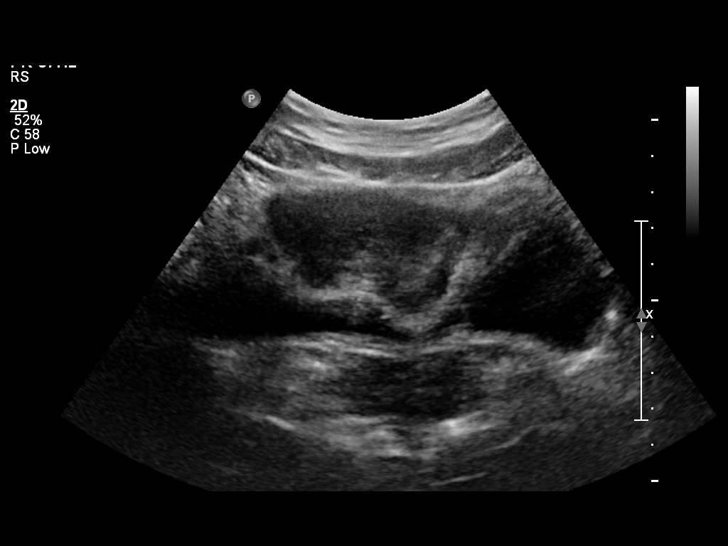
[im 19/47]
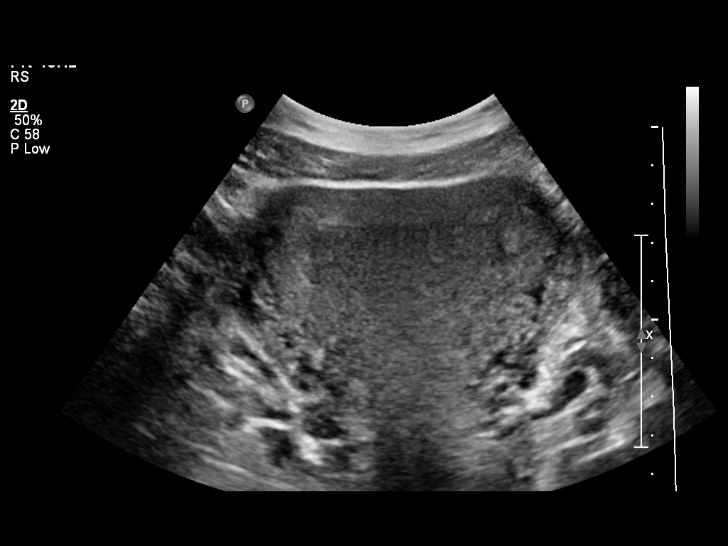
[im 23/47]
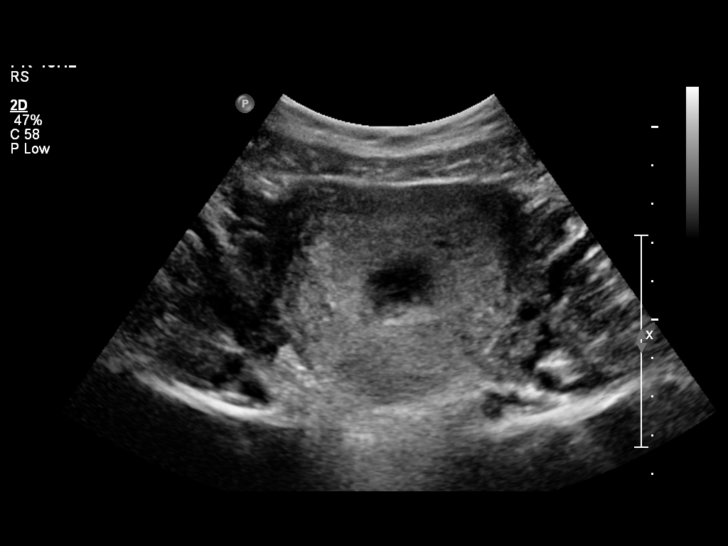
[im 26/47]
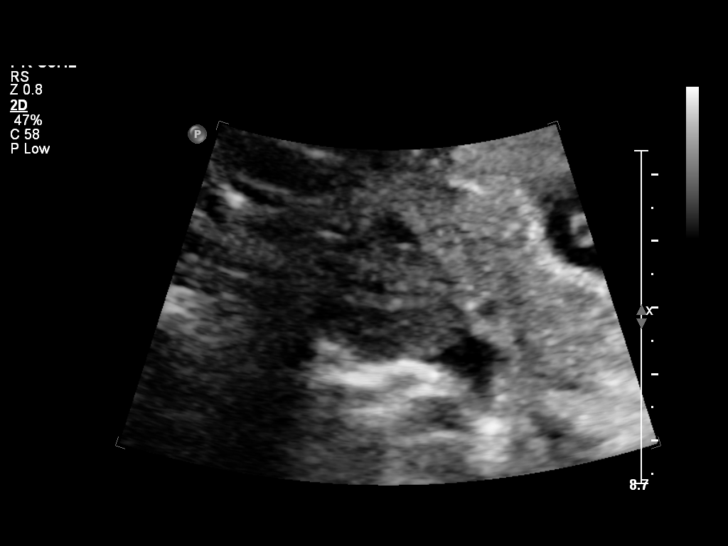
[im 29/47]
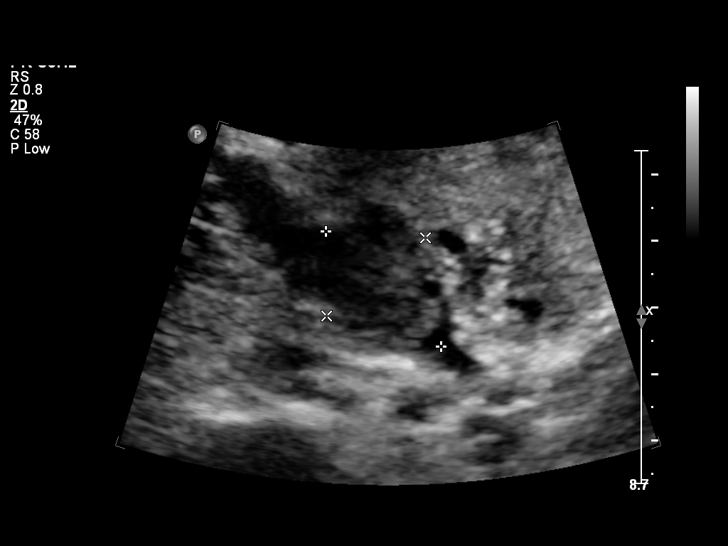
[im 33/47]
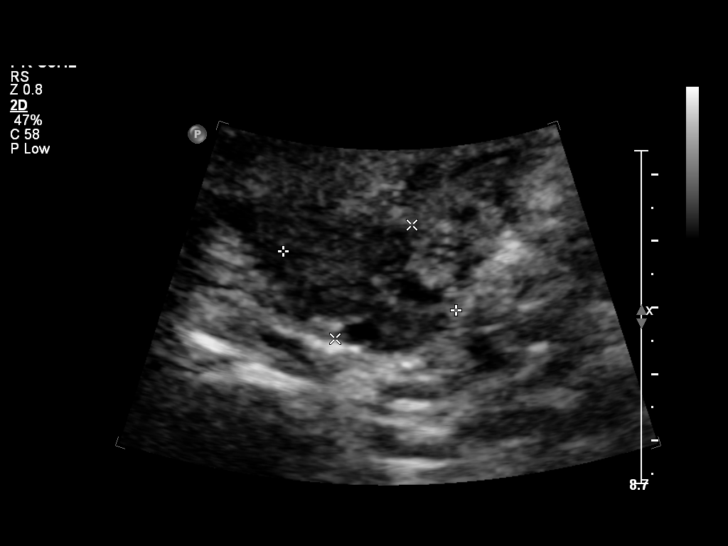
[im 36/47]
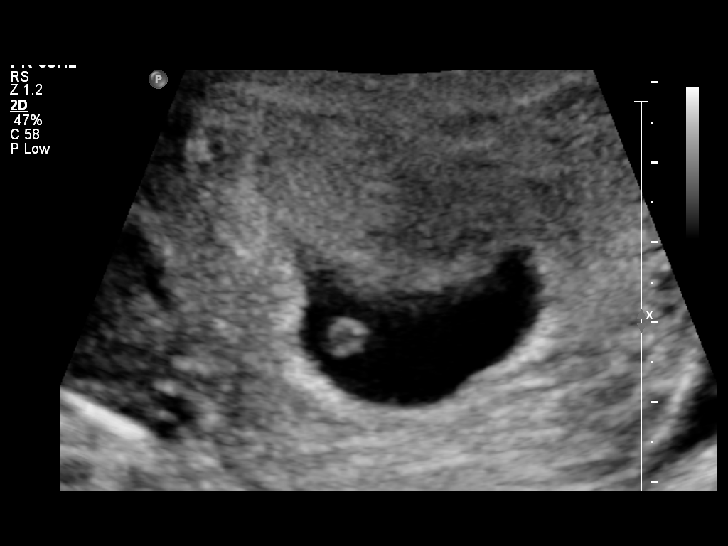
[im 40/47]
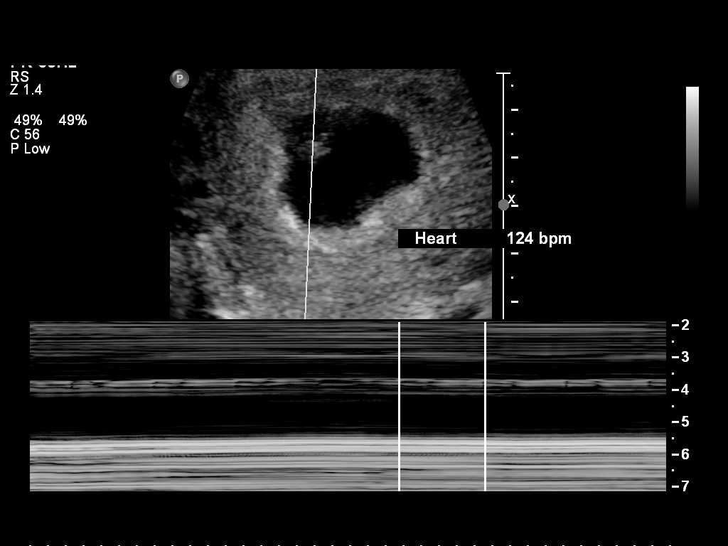
[im 43/47]
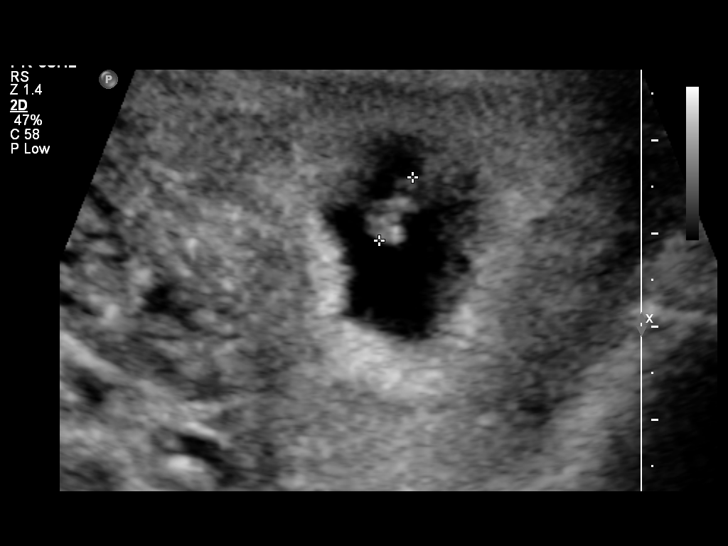
[im 47/47]
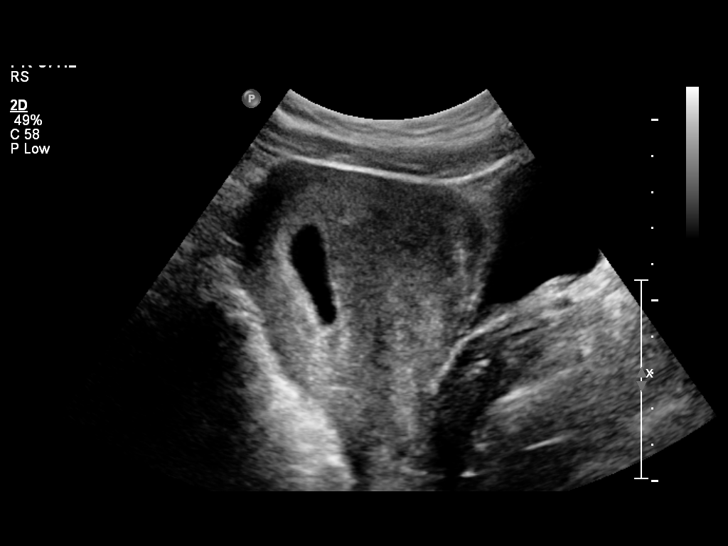

[14 of 28 positions shown; findings below may reference images not displayed]

FINDINGS: Intrauterine gestational sac: Visualized/normal in shape.

Yolk sac:  Yes

Embryo:  Yes

Cardiac Activity: Yes

Heart Rate: 126 bpm

MSD:   mm    w     d

CRL:   7.5  mm   6 w 5 d                  US EDC: 03/10/2014

Maternal uterus/adnexae:

Subchorionic hemorrhage: None

Right ovary: Normal

Left ovary: Normal

Other :None

Free fluid: None
IMPRESSION: 1. Single living intrauterine gestation with an estimated
gestational age of 6 weeks and 5 days.

## 2022-09-20 NOTE — L&D Delivery Note (Signed)
OB/GYN Faculty Practice Delivery Note  Kelsey Lara is a 32 y.o. [id number] s/p NSVD at [redacted]w[redacted]d. She was admitted for normal labor.   ROM: 4h 49m with clear fluid GBS Status: Negative/-- (08/20 1537) Maximum Maternal Temperature: Temp (24hrs), Avg:99 F (37.2 C), Min:97.9 F (36.6 C), Max:99.7 F (37.6 C)   Labor Progress: Initial SVE: 6/80/-3.  No augmentation  required. She then progressed to complete.   Delivery Date/Time: 05/31/23 0629 Delivery: Called to room and patient was complete and pushing. Head delivered ROA. Nuchal cord present. Delivered through. Shoulder and body delivered in usual fashion. Infant with spontaneous cry, placed on mother's abdomen, dried and stimulated. Cord clamped x 2 after 1-minute delay, and cut by FOB. Cord blood drawn. Placenta delivered spontaneously with gentle cord traction and fundal massage after ~20 minutes. Placenta examined and found to be intact. Fundus firm with massage and Pitocin. Labia, perineum, vagina, and cervix inspected with no tears.  Baby Weight: pending  Placenta: 3 vessel, intact. Sent to L&D Complications: None Lacerations: None EBL: 350 mL Anesthesia: none  Infant:  APGAR (1 MIN): 5  APGAR (5 MINS): 7  APGAR (10 MINS):    Joanne Gavel, MD [facility] Family Medicine Fellow, [facility] for [facility], [facility] 05/31/2023, 7:07 AM

## 2022-12-20 DIAGNOSIS — Z419 Encounter for procedure for purposes other than remedying health state, unspecified: Secondary | ICD-10-CM | POA: Diagnosis not present

## 2023-01-10 ENCOUNTER — Encounter: Payer: Self-pay | Admitting: *Deleted

## 2023-01-10 DIAGNOSIS — Z3480 Encounter for supervision of other normal pregnancy, unspecified trimester: Secondary | ICD-10-CM | POA: Insufficient documentation

## 2023-01-12 ENCOUNTER — Other Ambulatory Visit (HOSPITAL_COMMUNITY)
Admission: RE | Admit: 2023-01-12 | Discharge: 2023-01-12 | Disposition: A | Discharge status: Home / Self Care | Payer: Medicaid Other | Source: Ambulatory Visit | Attending: Obstetrics and Gynecology | Admitting: Obstetrics and Gynecology

## 2023-01-12 ENCOUNTER — Other Ambulatory Visit (INDEPENDENT_AMBULATORY_CARE_PROVIDER_SITE_OTHER): Payer: Medicaid Other

## 2023-01-12 ENCOUNTER — Ambulatory Visit (INDEPENDENT_AMBULATORY_CARE_PROVIDER_SITE_OTHER): Payer: Medicaid Other | Admitting: Obstetrics and Gynecology

## 2023-01-12 ENCOUNTER — Encounter: Payer: Self-pay | Admitting: Obstetrics and Gynecology

## 2023-01-12 VITALS — BP 117/79 | HR 87 | Wt 161.0 lb

## 2023-01-12 DIAGNOSIS — Z3480 Encounter for supervision of other normal pregnancy, unspecified trimester: Secondary | ICD-10-CM

## 2023-01-12 DIAGNOSIS — Z3A19 19 weeks gestation of pregnancy: Secondary | ICD-10-CM

## 2023-01-12 DIAGNOSIS — Z1339 Encounter for screening examination for other mental health and behavioral disorders: Secondary | ICD-10-CM

## 2023-01-12 DIAGNOSIS — Z3482 Encounter for supervision of other normal pregnancy, second trimester: Secondary | ICD-10-CM

## 2023-01-12 NOTE — Progress Notes (Addendum)
Last pap- 2022- normal results at [place] Pt is undecided about genetic testing

## 2023-01-12 NOTE — Progress Notes (Signed)
     Subjective:   Kelsey Lara is a 32 y.o. [id number] at [redacted]w[redacted]d by midtrimester ultrasound being seen today for her first obstetrical visit.  Her obstetrical history is significant for  none . Pregnancy history fully reviewed.  Patient reports backache. Had spotting for a few days 3 weeks ago.  HISTORY: OB History  Gravida Para Term Preterm AB Living  0 1 2  SAB IAB Ectopic Multiple Live Births  1 0 0 0 2    # Outcome Date GA Lbr Len/2nd Weight Sex Delivery Anes PTL Lv  4 Current           3 Term 03/15/14 [redacted]w[redacted]d 08:59 8 lb 5 oz (3.771 kg) F Vag-Spont None  LIV     Name: Kelsey Lara     Apgar1: 8  Apgar5: 9  2 Term 09/26/07 [redacted]w[redacted]d 06:00 7 lb 1 oz (3.204 kg) F Vag-Spont   LIV  1 SAB              Last pap smear: Reports normal pap 2022 [facility]  History reviewed. No pertinent past medical history. History reviewed. No pertinent surgical history. History reviewed. No pertinent family history. Social History   Tobacco Use   Smoking status: Never   Smokeless tobacco: Never  Vaping Use   Vaping Use: Never used  Substance Use Topics   Alcohol use: No   Drug use: No   No Known Allergies Current Outpatient Medications on File Prior to Visit  Medication Sig Dispense Refill   Prenatal Vit-Fe Fumarate-FA (PRENATAL MULTIVITAMIN) TABS tablet Take 1 tablet by mouth daily at 12 noon.     No current facility-administered medications on file prior to visit.    Exam   Vitals:   01/12/23 1441  BP: 117/79  Pulse: 87  Weight: 161 lb (73 kg)     General:  Alert, oriented and cooperative. Patient is in no acute distress.  Breast: Deferred  Cardiovascular: Normal heart rate noted  Respiratory: Normal respiratory effort, no problems with respiration noted  Abdomen: Soft, gravid, appropriate for gestational age.  Pain/Pressure: Absent     Pelvic: Performed in presence of chaperone. NEFG. Cervix partially visualized, normal.         Extremities: Normal range of  motion.  Edema: None  Mental Status: Normal mood and affect. Normal behavior. Normal judgment and thought content.    Assessment:   Pregnancy: [id number] Patient Active Problem List   Diagnosis Date Noted   Supervision of other normal pregnancy, antepartum 01/10/2023   Plan:  1. Supervision of other normal pregnancy, antepartum Initial labs drawn. Continue prenatal vitamins. Genetic Screening discussed: NIPS, carrier screening and AFP, pt considering her options. Ultrasound discussed; fetal anatomic survey: ordered. Problem list reviewed and updated. The nature of [facility] Faculty Practice with multiple MDs and other Advanced Practice Providers was explained to patient; also emphasized that residents, students are part of our team. Routine obstetric precautions reviewed. - Cytology - PAP( [place]) - Pregnancy, Initial Screen - [place] MFM OB COMP + 14 WK; Future - US OB Limited; Future - Babyscripts Schedule Optimization - [place] MFM OB DETAIL +14 WK; Future  Return in about 5 weeks (around 02/16/2023) for return OB at 24 weeks.  Harvie Bridge, MD Obstetrician & Gynecologist, [facility] for [employer], [facility]

## 2023-01-14 LAB — CYTOLOGY - PAP
Comment: NEGATIVE
Diagnosis: NEGATIVE
High risk HPV: NEGATIVE

## 2023-01-15 LAB — PREGNANCY, INITIAL SCREEN
Antibody Screen: NEGATIVE
Basophils Absolute: 0.1 10*3/uL (ref 0.0–0.2)
Basos: 1 %
Bilirubin, UA: NEGATIVE
Chlamydia trachomatis, NAA: NEGATIVE
EOS (ABSOLUTE): 0.3 10*3/uL (ref 0.0–0.4)
Eos: 3 %
Glucose, UA: NEGATIVE
HCV Ab: NONREACTIVE
HIV Screen 4th Generation wRfx: NONREACTIVE
Hematocrit: 38.7 % (ref 34.0–46.6)
Hemoglobin: 13 g/dL (ref 11.1–15.9)
Hepatitis B Surface Ag: NEGATIVE
Immature Grans (Abs): 0.5 10*3/uL — ABNORMAL HIGH (ref 0.0–0.1)
Immature Granulocytes: 5 %
Ketones, UA: NEGATIVE
Leukocytes,UA: NEGATIVE
Lymphocytes Absolute: 1.5 10*3/uL (ref 0.7–3.1)
Lymphs: 15 %
MCH: 32.3 pg (ref 26.6–33.0)
MCHC: 33.6 g/dL (ref 31.5–35.7)
MCV: 96 fL (ref 79–97)
Monocytes Absolute: 0.7 10*3/uL (ref 0.1–0.9)
Monocytes: 7 %
Neisseria Gonorrhoeae by PCR: NEGATIVE
Neutrophils Absolute: 6.9 10*3/uL (ref 1.4–7.0)
Neutrophils: 69 %
Nitrite, UA: NEGATIVE
Platelets: 341 10*3/uL (ref 150–450)
Protein,UA: NEGATIVE
RBC, UA: NEGATIVE
RBC: 4.02 x10E6/uL (ref 3.77–5.28)
RDW: 12.3 % (ref 11.7–15.4)
RPR Ser Ql: NONREACTIVE
Rh Factor: POSITIVE
Rubella Antibodies, IGG: <0.9 index — ABNORMAL LOW (ref >0.99)
Specific Gravity, UA: 1.016 (ref 1.005–1.030)
Urobilinogen, Ur: 0.2 mg/dL (ref 0.2–1.0)
WBC: 10 10*3/uL (ref 3.4–10.8)
pH, UA: 6.5 (ref 5.0–7.5)

## 2023-01-15 LAB — URINE CULTURE, OB REFLEX

## 2023-01-15 LAB — HCV INTERPRETATION

## 2023-01-15 LAB — MICROSCOPIC EXAMINATION
Casts: NONE SEEN /lpf
RBC, Urine: NONE SEEN /hpf (ref 0–2)

## 2023-01-19 DIAGNOSIS — Z419 Encounter for procedure for purposes other than remedying health state, unspecified: Secondary | ICD-10-CM | POA: Diagnosis not present

## 2023-02-07 DIAGNOSIS — Z3481 Encounter for supervision of other normal pregnancy, first trimester: Secondary | ICD-10-CM | POA: Diagnosis not present

## 2023-02-17 ENCOUNTER — Ambulatory Visit (INDEPENDENT_AMBULATORY_CARE_PROVIDER_SITE_OTHER): Payer: Medicaid Other | Admitting: Family Medicine

## 2023-02-17 ENCOUNTER — Other Ambulatory Visit: Payer: Self-pay | Admitting: Family Medicine

## 2023-02-17 VITALS — BP 116/74 | HR 92 | Wt 165.0 lb

## 2023-02-17 DIAGNOSIS — Z3A24 24 weeks gestation of pregnancy: Secondary | ICD-10-CM | POA: Diagnosis not present

## 2023-02-17 DIAGNOSIS — Z2839 Other underimmunization status: Secondary | ICD-10-CM | POA: Insufficient documentation

## 2023-02-17 DIAGNOSIS — Z3480 Encounter for supervision of other normal pregnancy, unspecified trimester: Secondary | ICD-10-CM | POA: Diagnosis not present

## 2023-02-17 DIAGNOSIS — O09899 Supervision of other high risk pregnancies, unspecified trimester: Secondary | ICD-10-CM

## 2023-02-17 DIAGNOSIS — Z3482 Encounter for supervision of other normal pregnancy, second trimester: Secondary | ICD-10-CM | POA: Diagnosis not present

## 2023-02-17 NOTE — Progress Notes (Signed)
   PRENATAL VISIT NOTE  Subjective:  Kelsey Lara is a 32 y.o. [id number] at [redacted]w[redacted]d being seen today for ongoing prenatal care.  She is currently monitored for the following issues for this low-risk pregnancy and has Supervision of other normal pregnancy, antepartum and Rubella non-immune status, antepartum on their problem list.  Patient reports no complaints.  Contractions: Not present. Vag. Bleeding: None.  Movement: Present. Denies leaking of fluid.   The following portions of the patient's history were reviewed and updated as appropriate: allergies, current medications, past family history, past medical history, past social history, past surgical history and problem list.   Objective:   Vitals:   02/17/23 0843  BP: 116/74  Pulse: 92  Weight: 165 lb (74.8 kg)    Fetal Status: Fetal Heart Rate (bpm): 145 Fundal Height: 23 cm Movement: Present     General:  Alert, oriented and cooperative. Patient is in no acute distress.  Skin: Skin is warm and dry. No rash noted.   Cardiovascular: Normal heart rate noted  Respiratory: Normal respiratory effort, no problems with respiration noted  Abdomen: Soft, gravid, appropriate for gestational age.  Pain/Pressure: Absent     Pelvic: Cervical exam deferred        Extremities: Normal range of motion.  Edema: None  Mental Status: Normal mood and affect. Normal behavior. Normal judgment and thought content.   Assessment and Plan:  Pregnancy: [id number] at [redacted]w[redacted]d 1. Supervision of other normal pregnancy, antepartum Anatomy u/s scheduled for tomorrow Repeat OB urine culture due to ESBL resistant E. Coli growing - Culture, OB Urine  2. Rubella non-immune status, antepartum MMR pp  Preterm labor symptoms and general obstetric precautions including but not limited to vaginal bleeding, contractions, leaking of fluid and fetal movement were reviewed in detail with the patient. Please refer to After Visit Summary for other counseling recommendations.    Return in 4 weeks (on 03/17/2023) for [facility], 28 wk labs.  Future Appointments  Date Time Provider Department Center  02/18/2023  7:30 AM [facility] NURSE [facility]  02/18/2023  7:45 AM [facility] US4 [facility]  03/14/2023  8:50 AM Lennart Pall, MD [facility]  04/12/2023  8:10 AM Rasch, Harolyn Rutherford, NP [facility]    Reva Bores, MD

## 2023-02-18 ENCOUNTER — Other Ambulatory Visit: Payer: Self-pay | Admitting: *Deleted

## 2023-02-18 ENCOUNTER — Ambulatory Visit: Payer: Medicaid Other | Admitting: *Deleted

## 2023-02-18 ENCOUNTER — Ambulatory Visit: Payer: Medicaid Other | Attending: Obstetrics and Gynecology

## 2023-02-18 ENCOUNTER — Encounter: Payer: Self-pay | Admitting: *Deleted

## 2023-02-18 VITALS — BP 110/62 | HR 79

## 2023-02-18 DIAGNOSIS — O3412 Maternal care for benign tumor of corpus uteri, second trimester: Secondary | ICD-10-CM | POA: Diagnosis not present

## 2023-02-18 DIAGNOSIS — Z3A25 25 weeks gestation of pregnancy: Secondary | ICD-10-CM | POA: Insufficient documentation

## 2023-02-18 DIAGNOSIS — Z362 Encounter for other antenatal screening follow-up: Secondary | ICD-10-CM

## 2023-02-18 DIAGNOSIS — Z3480 Encounter for supervision of other normal pregnancy, unspecified trimester: Secondary | ICD-10-CM | POA: Diagnosis not present

## 2023-02-18 DIAGNOSIS — Z3689 Encounter for other specified antenatal screening: Secondary | ICD-10-CM

## 2023-02-18 DIAGNOSIS — D259 Leiomyoma of uterus, unspecified: Secondary | ICD-10-CM

## 2023-02-18 DIAGNOSIS — Z363 Encounter for antenatal screening for malformations: Secondary | ICD-10-CM | POA: Diagnosis not present

## 2023-02-18 DIAGNOSIS — O99212 Obesity complicating pregnancy, second trimester: Secondary | ICD-10-CM

## 2023-02-18 DIAGNOSIS — D251 Intramural leiomyoma of uterus: Secondary | ICD-10-CM | POA: Insufficient documentation

## 2023-02-19 DIAGNOSIS — Z419 Encounter for procedure for purposes other than remedying health state, unspecified: Secondary | ICD-10-CM | POA: Diagnosis not present

## 2023-02-20 LAB — URINE CULTURE, OB REFLEX

## 2023-02-20 LAB — CULTURE, OB URINE

## 2023-02-20 MED ORDER — SULFAMETHOXAZOLE-TRIMETHOPRIM 800-160 MG PO TABS: 1.0000 | ORAL_TABLET | Freq: Two times a day (BID) | ORAL | 0 refills | Status: AC

## 2023-02-20 NOTE — Addendum Note (Signed)
Addended by: Reva Bores on: 02/20/2023 07:50 PM   Modules accepted: Orders

## 2023-02-23 ENCOUNTER — Encounter: Payer: Self-pay | Admitting: Obstetrics and Gynecology

## 2023-02-23 DIAGNOSIS — D219 Benign neoplasm of connective and other soft tissue, unspecified: Secondary | ICD-10-CM | POA: Insufficient documentation

## 2023-03-14 ENCOUNTER — Ambulatory Visit (INDEPENDENT_AMBULATORY_CARE_PROVIDER_SITE_OTHER): Payer: Medicaid Other | Admitting: Obstetrics and Gynecology

## 2023-03-14 VITALS — BP 108/74 | HR 94 | Wt 168.0 lb

## 2023-03-14 DIAGNOSIS — Z3A28 28 weeks gestation of pregnancy: Secondary | ICD-10-CM

## 2023-03-14 DIAGNOSIS — Z23 Encounter for immunization: Secondary | ICD-10-CM | POA: Diagnosis not present

## 2023-03-14 DIAGNOSIS — Z3480 Encounter for supervision of other normal pregnancy, unspecified trimester: Secondary | ICD-10-CM

## 2023-03-14 DIAGNOSIS — D219 Benign neoplasm of connective and other soft tissue, unspecified: Secondary | ICD-10-CM

## 2023-03-14 DIAGNOSIS — O09899 Supervision of other high risk pregnancies, unspecified trimester: Secondary | ICD-10-CM

## 2023-03-14 DIAGNOSIS — Z2839 Other underimmunization status: Secondary | ICD-10-CM

## 2023-03-14 NOTE — Progress Notes (Signed)
   PRENATAL VISIT NOTE  Subjective:  Kelsey Lara is a 32 y.o. [id number] at [redacted]w[redacted]d being seen today for ongoing prenatal care.  She is currently monitored for the following issues for this low-risk pregnancy and has Supervision of other normal pregnancy, antepartum; Rubella non-immune status, antepartum; and Fibroids on their problem list.  Patient reports doing well overall.  Contractions: Not present. Vag. Bleeding: None.  Movement: Present. Denies leaking of fluid.   The following portions of the patient's history were reviewed and updated as appropriate: allergies, current medications, past family history, past medical history, past social history, past surgical history and problem list.   Objective:   Vitals:   03/14/23 0918  BP: 108/74  Pulse: 94  Weight: 168 lb (76.2 kg)    Fetal Status: Fetal Heart Rate (bpm): 155   Movement: Present     General:  Alert, oriented and cooperative. Patient is in no acute distress.  Skin: Skin is warm and dry. No rash noted.   Cardiovascular: Normal heart rate noted  Respiratory: Normal respiratory effort, no problems with respiration noted  Abdomen: Soft, gravid, appropriate for gestational age.  Pain/Pressure: Absent      Assessment and Plan:  Pregnancy: [id number] at [redacted]w[redacted]d 1. Supervision of other normal pregnancy, antepartum 2. [redacted] weeks gestation of pregnancy Tdap today Discussed cone healthy baby website - HIV antibody (with reflex) - CBC - RPR - Glucose Tolerance, 2 Hours w/1 Hour  3. Rubella non-immune status, antepartum PP MMR  4. Fibroids Growth [place] scheduled 7/2  Preterm labor symptoms and general obstetric precautions including but not limited to vaginal bleeding, contractions, leaking of fluid and fetal movement were reviewed in detail with the patient.  Please refer to After Visit Summary for other counseling recommendations.   Return in about 4 weeks (around 04/11/2023) for return OB at 32 weeks.  Future Appointments  Date  Time Provider Department Center  03/22/2023  7:30 AM [facility] NURSE [facility]  03/22/2023  7:45 AM [facility] US4 [facility]  04/12/2023  8:10 AM Rasch, Harolyn Rutherford, NP [facility]    Lennart Pall, MD

## 2023-03-14 NOTE — Patient Instructions (Signed)
[email or web address] 

## 2023-03-15 LAB — HIV ANTIBODY (ROUTINE TESTING W REFLEX): HIV Screen 4th Generation wRfx: NONREACTIVE

## 2023-03-15 LAB — CBC
Hematocrit: 36.5 % (ref 34.0–46.6)
Hemoglobin: 12 g/dL (ref 11.1–15.9)
MCH: 31.3 pg (ref 26.6–33.0)
MCHC: 32.9 g/dL (ref 31.5–35.7)
MCV: 95 fL (ref 79–97)
Platelets: 324 10*3/uL (ref 150–450)
RBC: 3.84 x10E6/uL (ref 3.77–5.28)
RDW: 12.2 % (ref 11.7–15.4)
WBC: 9.2 10*3/uL (ref 3.4–10.8)

## 2023-03-15 LAB — RPR: RPR Ser Ql: NONREACTIVE

## 2023-03-15 LAB — GLUCOSE TOLERANCE, 2 HOURS W/ 1HR
Glucose, 1 hour: 132 mg/dL (ref 70–179)
Glucose, 2 hour: 72 mg/dL (ref 70–152)
Glucose, Fasting: 75 mg/dL (ref 70–91)

## 2023-03-21 DIAGNOSIS — Z419 Encounter for procedure for purposes other than remedying health state, unspecified: Secondary | ICD-10-CM | POA: Diagnosis not present

## 2023-03-22 ENCOUNTER — Ambulatory Visit: Payer: Medicaid Other | Admitting: *Deleted

## 2023-03-22 ENCOUNTER — Ambulatory Visit: Payer: Medicaid Other | Attending: Obstetrics and Gynecology

## 2023-03-22 VITALS — BP 111/72 | HR 85

## 2023-03-22 DIAGNOSIS — Z3A29 29 weeks gestation of pregnancy: Secondary | ICD-10-CM | POA: Diagnosis not present

## 2023-03-22 DIAGNOSIS — O3412 Maternal care for benign tumor of corpus uteri, second trimester: Secondary | ICD-10-CM | POA: Diagnosis present

## 2023-03-22 DIAGNOSIS — D259 Leiomyoma of uterus, unspecified: Secondary | ICD-10-CM | POA: Insufficient documentation

## 2023-03-22 DIAGNOSIS — O99213 Obesity complicating pregnancy, third trimester: Secondary | ICD-10-CM | POA: Insufficient documentation

## 2023-03-22 DIAGNOSIS — Z362 Encounter for other antenatal screening follow-up: Secondary | ICD-10-CM | POA: Diagnosis not present

## 2023-03-22 DIAGNOSIS — O3413 Maternal care for benign tumor of corpus uteri, third trimester: Secondary | ICD-10-CM | POA: Insufficient documentation

## 2023-03-22 DIAGNOSIS — Z3480 Encounter for supervision of other normal pregnancy, unspecified trimester: Secondary | ICD-10-CM

## 2023-03-22 DIAGNOSIS — O99212 Obesity complicating pregnancy, second trimester: Secondary | ICD-10-CM | POA: Diagnosis present

## 2023-04-12 ENCOUNTER — Ambulatory Visit (INDEPENDENT_AMBULATORY_CARE_PROVIDER_SITE_OTHER): Payer: Medicaid Other | Admitting: Obstetrics and Gynecology

## 2023-04-12 VITALS — BP 104/72 | HR 83 | Wt 171.0 lb

## 2023-04-12 DIAGNOSIS — O2343 Unspecified infection of urinary tract in pregnancy, third trimester: Secondary | ICD-10-CM

## 2023-04-12 DIAGNOSIS — R300 Dysuria: Secondary | ICD-10-CM | POA: Diagnosis not present

## 2023-04-12 DIAGNOSIS — Z3480 Encounter for supervision of other normal pregnancy, unspecified trimester: Secondary | ICD-10-CM

## 2023-04-12 MED ORDER — CEFADROXIL 500 MG PO CAPS: 500.0000 mg | ORAL_CAPSULE | Freq: Two times a day (BID) | ORAL | 0 refills | Status: DC

## 2023-04-12 NOTE — Progress Notes (Signed)
   PRENATAL VISIT NOTE  Subjective:  Kelsey Lara is a 32 y.o. [id number] at 107w6d being seen today for ongoing prenatal care.  She is currently monitored for the following issues for this low-risk pregnancy and has Supervision of other normal pregnancy, antepartum; Rubella non-immune status, antepartum; and Fibroids on their problem list.  Patient reports  Pressure in lower pelvis. No bleeding or dysuria, no flank pain or fever .  Contractions: Not present. Vag. Bleeding: None.  Movement: Present. Denies leaking of fluid.   The following portions of the patient's history were reviewed and updated as appropriate: allergies, current medications, past family history, past medical history, past social history, past surgical history and problem list.   Objective:   Vitals:   04/12/23 0818  BP: 104/72  Pulse: 83  Weight: 171 lb (77.6 kg)    Fetal Status: Fetal Heart Rate (bpm): 140 Fundal Height: 32 cm Movement: Present     General:  Alert, oriented and cooperative. Patient is in no acute distress.  Skin: Skin is warm and dry. No rash noted.   Cardiovascular: Normal heart rate noted  Respiratory: Normal respiratory effort, no problems with respiration noted  Abdomen: Soft, gravid, appropriate for gestational age.  Pain/Pressure: Present     Pelvic: Cervical exam deferred        Extremities: Normal range of motion.  Edema: None  Mental Status: Normal mood and affect. Normal behavior. Normal judgment and thought content.    No results found for this or any previous visit (from the past 48 hour(s)).    Assessment and Plan:  Pregnancy: [id number] at [redacted]w[redacted]d 1. Dysuria  - +Nitrites on UA. Will treat for UTI - Culture, OB Urine - Call the office if symptoms worsen - Rx Duricef - 2 hour GTT normal.   Preterm labor symptoms and general obstetric precautions including but not limited to vaginal bleeding, contractions, leaking of fluid and fetal movement were reviewed in detail with the  patient. Please refer to After Visit Summary for other counseling recommendations.   No follow-ups on file.  No future appointments.  Venia Carbon, NP

## 2023-04-21 DIAGNOSIS — Z419 Encounter for procedure for purposes other than remedying health state, unspecified: Secondary | ICD-10-CM | POA: Diagnosis not present

## 2023-04-26 ENCOUNTER — Encounter: Payer: Medicaid Other | Admitting: Obstetrics and Gynecology

## 2023-05-10 ENCOUNTER — Other Ambulatory Visit (HOSPITAL_COMMUNITY)
Admission: RE | Admit: 2023-05-10 | Discharge: 2023-05-10 | Disposition: A | Discharge status: Home / Self Care | Payer: Medicaid Other | Source: Ambulatory Visit | Attending: Obstetrics and Gynecology | Admitting: Obstetrics and Gynecology

## 2023-05-10 ENCOUNTER — Ambulatory Visit (INDEPENDENT_AMBULATORY_CARE_PROVIDER_SITE_OTHER): Payer: Medicaid Other | Admitting: Obstetrics and Gynecology

## 2023-05-10 VITALS — BP 112/76 | HR 87 | Wt 175.0 lb

## 2023-05-10 DIAGNOSIS — Z3A36 36 weeks gestation of pregnancy: Secondary | ICD-10-CM

## 2023-05-10 DIAGNOSIS — Z3483 Encounter for supervision of other normal pregnancy, third trimester: Secondary | ICD-10-CM | POA: Diagnosis not present

## 2023-05-10 DIAGNOSIS — Z3480 Encounter for supervision of other normal pregnancy, unspecified trimester: Secondary | ICD-10-CM | POA: Insufficient documentation

## 2023-05-10 NOTE — Progress Notes (Signed)
   PRENATAL VISIT NOTE  Subjective:  Kelsey Lara is a 32 y.o. [id number] at [redacted]w[redacted]d being seen today for ongoing prenatal care.  She is currently monitored for the following issues for this low-risk pregnancy and has Supervision of other normal pregnancy, antepartum; Rubella non-immune status, antepartum; and Fibroids on their problem list.  Patient reports no complaints.  Contractions: Not present. Vag. Bleeding: None.  Movement: Present. Denies leaking of fluid.   The following portions of the patient's history were reviewed and updated as appropriate: allergies, current medications, past family history, past medical history, past social history, past surgical history and problem list.   Objective:   Vitals:   05/10/23 1537  BP: 112/76  Pulse: 87  Weight: 175 lb (79.4 kg)    Fetal Status: Fetal Heart Rate (bpm): 144 Fundal Height: 38 cm Movement: Present     General:  Alert, oriented and cooperative. Patient is in no acute distress.  Skin: Skin is warm and dry. No rash noted.   Cardiovascular: Normal heart rate noted  Respiratory: Normal respiratory effort, no problems with respiration noted  Abdomen: Soft, gravid, appropriate for gestational age.  Pain/Pressure: Absent     Pelvic: Cervical exam performed in the presence of a chaperone Dilation: 2 Effacement (%): 70 Station: -3  Extremities: Normal range of motion.  Edema: None  Mental Status: Normal mood and affect. Normal behavior. Normal judgment and thought content.   Assessment and Plan:  Pregnancy: [id number] at [redacted]w[redacted]d 1. Supervision of other normal pregnancy, antepartum  - Culture, beta strep (group b only) - Cervicovaginal ancillary only( [place])  Term labor symptoms and general obstetric precautions including but not limited to vaginal bleeding, contractions, leaking of fluid and fetal movement were reviewed in detail with the patient. Please refer to After Visit Summary for other counseling recommendations.   No  follow-ups on file.  Future Appointments  Date Time Provider Department Center  05/19/2023  9:30 AM Lennart Pall, MD [facility]    Venia Carbon, NP

## 2023-05-11 LAB — CERVICOVAGINAL ANCILLARY ONLY
Chlamydia: NEGATIVE
Comment: NEGATIVE
Comment: NORMAL
Neisseria Gonorrhea: NEGATIVE

## 2023-05-13 LAB — CULTURE, BETA STREP (GROUP B ONLY): Strep Gp B Culture: NEGATIVE

## 2023-05-19 ENCOUNTER — Ambulatory Visit (INDEPENDENT_AMBULATORY_CARE_PROVIDER_SITE_OTHER): Payer: Medicaid Other | Admitting: Obstetrics and Gynecology

## 2023-05-19 VITALS — BP 105/74 | HR 82 | Wt 176.0 lb

## 2023-05-19 DIAGNOSIS — D219 Benign neoplasm of connective and other soft tissue, unspecified: Secondary | ICD-10-CM

## 2023-05-19 DIAGNOSIS — Z3A38 38 weeks gestation of pregnancy: Secondary | ICD-10-CM

## 2023-05-19 DIAGNOSIS — Z3480 Encounter for supervision of other normal pregnancy, unspecified trimester: Secondary | ICD-10-CM

## 2023-05-19 DIAGNOSIS — O09899 Supervision of other high risk pregnancies, unspecified trimester: Secondary | ICD-10-CM

## 2023-05-19 DIAGNOSIS — Z2839 Other underimmunization status: Secondary | ICD-10-CM

## 2023-05-19 NOTE — Progress Notes (Signed)
   PRENATAL VISIT NOTE  Subjective:  Kelsey Lara is a 32 y.o. [id number] at [redacted]w[redacted]d being seen today for ongoing prenatal care.  She is currently monitored for the following issues for this low-risk pregnancy and has Supervision of other normal pregnancy, antepartum; Rubella non-immune status, antepartum; and Fibroids on their problem list.  Patient reports no complaints. More pelvic pressure but she feels it is c/w normal pregnancy symptoms.  Contractions: Irritability. Vag. Bleeding: None.  Movement: Present. Denies leaking of fluid.   The following portions of the patient's history were reviewed and updated as appropriate: allergies, current medications, past family history, past medical history, past social history, past surgical history and problem list.   Objective:   Vitals:   05/19/23 0934  BP: 105/74  Pulse: 82  Weight: 176 lb (79.8 kg)    Fetal Status: Fetal Heart Rate (bpm): 140 Fundal Height: 38 cm Movement: Present     General:  Alert, oriented and cooperative. Patient is in no acute distress.  Skin: Skin is warm and dry. No rash noted.   Cardiovascular: Normal heart rate noted  Respiratory: Normal respiratory effort, no problems with respiration noted  Abdomen: Soft, gravid, appropriate for gestational age.  Pain/Pressure: Present      Assessment and Plan:  Pregnancy: [id number] at [redacted]w[redacted]d 1. Supervision of other normal pregnancy, antepartum 2. [redacted] weeks gestation of pregnancy Discussed membrane sweep next appt & pdIOL if no SOL prior to 41w Reviewed si/sx labor  3. Rubella non-immune status, antepartum PP MMR  4. Fibroids 4.2cm anterior Normal growth at [redacted]w[redacted]d EFW 54%ile  Please refer to After Visit Summary for other counseling recommendations.   Return in about 1 week (around 05/26/2023) for return OB at 39 weeks.  Future Appointments  Date Time Provider Department Center  05/24/2023  8:30 AM Sue Lush, FNP [facility]  06/01/2023  8:30 AM Milas Hock,  MD [facility]    Lennart Pall, MD

## 2023-05-22 DIAGNOSIS — Z419 Encounter for procedure for purposes other than remedying health state, unspecified: Secondary | ICD-10-CM | POA: Diagnosis not present

## 2023-05-24 ENCOUNTER — Ambulatory Visit (INDEPENDENT_AMBULATORY_CARE_PROVIDER_SITE_OTHER): Payer: Medicaid Other | Admitting: Obstetrics and Gynecology

## 2023-05-24 VITALS — BP 111/79 | HR 91 | Wt 175.0 lb

## 2023-05-24 DIAGNOSIS — Z3A38 38 weeks gestation of pregnancy: Secondary | ICD-10-CM

## 2023-05-24 DIAGNOSIS — O09899 Supervision of other high risk pregnancies, unspecified trimester: Secondary | ICD-10-CM

## 2023-05-24 DIAGNOSIS — Z2839 Other underimmunization status: Secondary | ICD-10-CM

## 2023-05-24 DIAGNOSIS — Z3480 Encounter for supervision of other normal pregnancy, unspecified trimester: Secondary | ICD-10-CM

## 2023-05-24 NOTE — Progress Notes (Signed)
   PRENATAL VISIT NOTE  Subjective:  Kelsey Lara is a 32 y.o. [id number] at [redacted]w[redacted]d being seen today for ongoing prenatal care.  She is currently monitored for the following issues for this low-risk pregnancy and has Supervision of other normal pregnancy, antepartum; Rubella non-immune status, antepartum; and Fibroids on their problem list.  Patient reports no complaints.  Contractions: Irritability. Vag. Bleeding: None.  Movement: Present. Denies leaking of fluid.   The following portions of the patient's history were reviewed and updated as appropriate: allergies, current medications, past family history, past medical history, past social history, past surgical history and problem list.   Objective:   Vitals:   05/24/23 0841  BP: 111/79  Pulse: 91  Weight: 175 lb (79.4 kg)    Fetal Status: Fetal Heart Rate (bpm): 146   Movement: Present  Presentation: Vertex  General:  Alert, oriented and cooperative. Patient is in no acute distress.  Skin: Skin is warm and dry. No rash noted.   Cardiovascular: Normal heart rate noted  Respiratory: Normal respiratory effort, no problems with respiration noted  Abdomen: Soft, gravid, appropriate for gestational age.  Pain/Pressure: Present     Pelvic: Cervical exam performed in the presence of a chaperone Dilation: 3 Effacement (%): 70 Station: -2  Extremities: Normal range of motion.  Edema: Trace  Mental Status: Normal mood and affect. Normal behavior. Normal judgment and thought content.   Assessment and Plan:  Pregnancy: [id number] at [redacted]w[redacted]d 1. Supervision of other normal pregnancy, antepartum BP and FHR normal Feeling regular fetal movement  2. Rubella non-immune status, antepartum MMR pp  3. [redacted] weeks gestation of pregnancy Desires SVE  and  3/70/-3 Membrane sweep discussed, she consented, and was performed. Precautions discussed when to go to MAU    Preterm labor symptoms and general obstetric precautions including but not limited to vaginal  bleeding, contractions, leaking of fluid and fetal movement were reviewed in detail with the patient. Please refer to After Visit Summary for other counseling recommendations.   Return in one week for routine prenatal  Future Appointments  Date Time Provider Department Center  06/01/2023  8:30 AM Milas Hock, MD [facility]    Albertine Grates, FNP

## 2023-05-26 ENCOUNTER — Inpatient Hospital Stay (HOSPITAL_COMMUNITY)
Admission: AD | Admit: 2023-05-26 | Discharge: 2023-05-26 | Disposition: A | Discharge status: Home / Self Care | Payer: Medicaid Other | Attending: Obstetrics & Gynecology | Admitting: Obstetrics & Gynecology

## 2023-05-26 ENCOUNTER — Encounter (HOSPITAL_COMMUNITY): Payer: Self-pay | Admitting: Obstetrics & Gynecology

## 2023-05-26 DIAGNOSIS — O26893 Other specified pregnancy related conditions, third trimester: Secondary | ICD-10-CM | POA: Insufficient documentation

## 2023-05-26 DIAGNOSIS — O98513 Other viral diseases complicating pregnancy, third trimester: Secondary | ICD-10-CM | POA: Diagnosis not present

## 2023-05-26 DIAGNOSIS — N858 Other specified noninflammatory disorders of uterus: Secondary | ICD-10-CM | POA: Diagnosis not present

## 2023-05-26 DIAGNOSIS — Z3A39 39 weeks gestation of pregnancy: Secondary | ICD-10-CM | POA: Insufficient documentation

## 2023-05-26 DIAGNOSIS — Z1152 Encounter for screening for COVID-19: Secondary | ICD-10-CM | POA: Diagnosis not present

## 2023-05-26 DIAGNOSIS — B349 Viral infection, unspecified: Secondary | ICD-10-CM | POA: Diagnosis not present

## 2023-05-26 LAB — SARS CORONAVIRUS 2 BY RT PCR: SARS Coronavirus 2 by RT PCR: NEGATIVE

## 2023-05-26 MED ORDER — ACETAMINOPHEN 500 MG PO TABS
1000.0000 mg | ORAL_TABLET | Freq: Once | ORAL | Status: AC
  Administered 2023-05-26: 1000 mg via ORAL
  Filled 2023-05-26: qty 2

## 2023-05-26 NOTE — MAU Provider Note (Signed)
Chief Complaint: Decreased Fetal Movement, Nasal Congestion, Cough, Pelvic Pain, and Headache   Event Date/Time   First Provider Initiated Contact with Patient 05/26/23 1629      SUBJECTIVE HPI: Kelsey Lara is a 32 y.o. O1H0865 at [redacted]w[redacted]d by early ultrasound who presents to maternity admissions reporting fever/chills, cough, congestion, DFM.  Patient notes that her mom started to feel poorly yesterday. She woke up this AM with congestion, sore throat, nausea, diarrhea. Has been able to stay hydrated. No vomiting. Mild pelvic pressure. Not feeling any regular ctx. No VB, LOF. +FM but less than usual.  HPI  History reviewed. No pertinent past medical history. History reviewed. No pertinent surgical history. Social History   Socioeconomic History   Marital status: Single    Spouse name: Not on file   Number of children: Not on file   Years of education: Not on file   Highest education level: Not on file  Occupational History   Not on file  Tobacco Use   Smoking status: Never   Smokeless tobacco: Never  Vaping Use   Vaping status: Never Used  Substance and Sexual Activity   Alcohol use: No   Drug use: No   Sexual activity: Yes  Other Topics Concern   Not on file  Social History Narrative   Not on file   Social Determinants of Health   Financial Resource Strain: Not on file  Food Insecurity: Not on file  Transportation Needs: Not on file  Physical Activity: Not on file  Stress: Not on file  Social Connections: Not on file  Intimate Partner Violence: Not on file   No current facility-administered medications on file prior to encounter.   Current Outpatient Medications on File Prior to Encounter  Medication Sig Dispense Refill   Prenatal Vit-Fe Fumarate-FA (PRENATAL MULTIVITAMIN) TABS tablet Take 1 tablet by mouth daily at 12 noon.     No Known Allergies  ROS:  Pertinent positives/negatives listed above.  I have reviewed patient's Past Medical Hx, Surgical Hx, Family  Hx, Social Hx, medications and allergies.   Physical Exam  Patient Vitals for the past 24 hrs:  BP Temp Temp src Pulse Resp SpO2 Height Weight  05/26/23 1722 109/75 98.8 F (37.1 C) Oral 90 18 99 % -- --  05/26/23 1718 -- -- -- -- -- 98 % -- --  05/26/23 1717 -- -- -- -- -- 98 % -- --  05/26/23 1700 -- -- -- -- -- 98 % -- --  05/26/23 1646 -- -- -- -- -- 98 % -- --  05/26/23 1645 108/66 98.8 F (37.1 C) Oral 92 18 98 % -- --  05/26/23 1614 -- -- -- -- -- 99 % -- --  05/26/23 1551 115/70 97.9 F (36.6 C) Oral (!) 108 18 100 % 5\' 1"  (1.549 m) 80.4 kg   Constitutional: Well-developed, well-nourished female in no acute distress.  Cardiovascular: normal rate Respiratory: normal effort GI: Abd soft, non-tender MS: Extremities nontender, no edema, normal ROM Neurologic: Alert and oriented x 4.  GU: Neg CVAT.  Cervix: 4/80/-3  FHT:  Baseline 135 , moderate variability, accelerations present, no decelerations Contractions: irregular Q10+ minutes  LAB RESULTS Results for orders placed or performed during the hospital encounter of 05/26/23 (from the past 24 hour(s))  SARS Coronavirus 2 by RT PCR (hospital order, performed in [facility] hospital lab) *cepheid single result test* Anterior Nasal Swab     Status: None   Collection Time: 05/26/23  4:00 PM  Specimen: Anterior Nasal Swab  Result Value Ref Range   SARS Coronavirus 2 by RT PCR NEGATIVE NEGATIVE    O/Positive/-- (04/24 1608)  IMAGING No results found.  MAU Management/MDM: Orders Placed This Encounter  Procedures   SARS Coronavirus 2 by RT PCR (hospital order, performed in [facility] hospital lab) *cepheid single result test* Anterior Nasal Swab   Airborne and Contact precautions   Discharge patient    Meds ordered this encounter  Medications   acetaminophen (TYLENOL) tablet 1,000 mg    Covid swab obtained. Patient requested tylenol for symptomatic treatment. Denied anything for nausea. PO fluids.   COVID  returned negative. Discussed symptomatic care for viral infections during pregnancy. FHT Cat I persistently with excellent variability and frequent accels. Patient notes many more movements than 5 kicks in an hour.  Close return precautions given and discussed s/sx of labor, kick counts. Discussed that cervix is already dilated and very favorable, so any consistent contractions or concern for SROM, should come in quickly.  ASSESSMENT 1. Viral syndrome   2. Alteration in comfort associated with uterine contractions   3. [redacted] weeks gestation of pregnancy     PLAN Discharge home with strict return precautions. Allergies as of 05/26/2023   No Known Allergies      Medication List     TAKE these medications    prenatal multivitamin Tabs tablet Take 1 tablet by mouth daily at 12 noon.         Wylene Simmer, MD OB Fellow 05/26/2023  5:24 PM

## 2023-05-26 NOTE — Discharge Instructions (Signed)
Safe Medications in Pregnancy   Acne: Benzoyl Peroxide Salicylic Acid  Backache/Headache: Tylenol: 2 regular strength every 4 hours OR              2 Extra strength every 6 hours  Colds/Coughs/Allergies: Benadryl (alcohol free) 25 mg every 6 hours as needed Breath right strips Claritin Cepacol throat lozenges Chloraseptic throat spray Cold-Eeze- up to three times per day Cough drops, alcohol free Flonase (by prescription only) Guaifenesin Mucinex Robitussin DM (plain only, alcohol free) Saline nasal spray/drops Sudafed (pseudoephedrine) & Actifed ** use only after [redacted] weeks gestation and if you do not have high blood pressure Tylenol Vicks Vaporub Zinc lozenges Zyrtec   Constipation: Colace Ducolax suppositories Fleet enema Glycerin suppositories Metamucil Milk of magnesia Miralax Senokot Smooth move tea  Diarrhea: Kaopectate Imodium A-D  *NO pepto Bismol  Hemorrhoids: Anusol Anusol HC Preparation H Tucks  Indigestion: Tums Maalox Mylanta Zantac  Pepcid  Insomnia: Benadryl (alcohol free) 25mg  every 6 hours as needed Tylenol PM Unisom, no Gelcaps  Leg Cramps: Tums MagGel  Nausea/Vomiting:  Bonine Dramamine Emetrol Ginger extract Sea bands Meclizine  Nausea medication to take during pregnancy:  Unisom (doxylamine succinate 25 mg tablets) Take one tablet daily at bedtime. If symptoms are not adequately controlled, the dose can be increased to a maximum recommended dose of two tablets daily (1/2 tablet in the morning, 1/2 tablet mid-afternoon and one at bedtime). Vitamin B6 100mg  tablets. Take one tablet twice a day (up to 200 mg per day).  Skin Rashes: Aveeno products Benadryl cream or 25mg  every 6 hours as needed Calamine Lotion 1% cortisone cream  Yeast infection: Gyne-lotrimin 7 Monistat 7   **If taking multiple medications, please check labels to avoid duplicating the same active ingredients **take medication as directed on  the label ** Do not exceed 3000 mg of tylenol in 24 hours **Do not take medications that contain aspirin or ibuprofen

## 2023-05-26 NOTE — MAU Note (Signed)
Kelsey Lara is a 31 y.o. at [redacted]w[redacted]d here in MAU reporting: felt like getting sick this morning.  Runny stuffy nose, HA, dry cough,denies fever. Pelvic pressure, more painful than usual. No bleeding, LOF.  +FM though not as active.  Onset of complaint: this morning Pain score: 8 Vitals:   05/26/23 1551  BP: 115/70  Pulse: (!) 108  Resp: 18  Temp: 97.9 F (36.6 C)  SpO2: 100%     FHT:132 Lab orders placed from triage:  covid

## 2023-05-30 ENCOUNTER — Other Ambulatory Visit: Payer: Self-pay

## 2023-05-30 ENCOUNTER — Inpatient Hospital Stay (HOSPITAL_COMMUNITY)
Admission: AD | Admit: 2023-05-30 | Discharge: 2023-06-01 | DRG: 807 | Disposition: A | Discharge status: Home / Self Care | Payer: Medicaid Other | Attending: Obstetrics and Gynecology | Admitting: Obstetrics and Gynecology

## 2023-05-30 ENCOUNTER — Encounter (HOSPITAL_COMMUNITY): Payer: Self-pay | Admitting: Obstetrics and Gynecology

## 2023-05-30 DIAGNOSIS — Z833 Family history of diabetes mellitus: Secondary | ICD-10-CM

## 2023-05-30 DIAGNOSIS — Z3480 Encounter for supervision of other normal pregnancy, unspecified trimester: Secondary | ICD-10-CM

## 2023-05-30 DIAGNOSIS — Z3A39 39 weeks gestation of pregnancy: Secondary | ICD-10-CM | POA: Diagnosis not present

## 2023-05-30 DIAGNOSIS — D219 Benign neoplasm of connective and other soft tissue, unspecified: Secondary | ICD-10-CM | POA: Diagnosis present

## 2023-05-30 DIAGNOSIS — D259 Leiomyoma of uterus, unspecified: Secondary | ICD-10-CM | POA: Diagnosis not present

## 2023-05-30 DIAGNOSIS — Z23 Encounter for immunization: Secondary | ICD-10-CM

## 2023-05-30 DIAGNOSIS — Z2839 Other underimmunization status: Secondary | ICD-10-CM

## 2023-05-30 DIAGNOSIS — Z8249 Family history of ischemic heart disease and other diseases of the circulatory system: Secondary | ICD-10-CM | POA: Diagnosis not present

## 2023-05-30 DIAGNOSIS — O3413 Maternal care for benign tumor of corpus uteri, third trimester: Secondary | ICD-10-CM | POA: Diagnosis not present

## 2023-05-30 DIAGNOSIS — O26893 Other specified pregnancy related conditions, third trimester: Secondary | ICD-10-CM | POA: Diagnosis not present

## 2023-05-30 DIAGNOSIS — O09899 Supervision of other high risk pregnancies, unspecified trimester: Secondary | ICD-10-CM

## 2023-05-30 LAB — CBC
HCT: 37.8 % (ref 36.0–46.0)
Hemoglobin: 12.4 g/dL (ref 12.0–15.0)
MCH: 30.2 pg (ref 26.0–34.0)
MCHC: 32.8 g/dL (ref 30.0–36.0)
MCV: 92 fL (ref 80.0–100.0)
Platelets: 291 10*3/uL (ref 150–400)
RBC: 4.11 MIL/uL (ref 3.87–5.11)
RDW: 13.4 % (ref 11.5–15.5)
WBC: 14.5 10*3/uL — ABNORMAL HIGH (ref 4.0–10.5)
nRBC: 0 % (ref 0.0–0.2)

## 2023-05-30 MED ORDER — FENTANYL CITRATE (PF) 100 MCG/2ML IJ SOLN: 50.0000 ug | INTRAMUSCULAR | Status: DC | PRN

## 2023-05-30 MED ORDER — LIDOCAINE HCL (PF) 1 % IJ SOLN: 30.0000 mL | INTRAMUSCULAR | Status: DC | PRN

## 2023-05-30 MED ORDER — LACTATED RINGERS IV SOLN: 500.0000 mL | INTRAVENOUS | Status: DC | PRN

## 2023-05-30 MED ORDER — OXYTOCIN-SODIUM CHLORIDE 30-0.9 UT/500ML-% IV SOLN
2.5000 [IU]/h | INTRAVENOUS | Status: DC
  Filled 2023-05-30: qty 500

## 2023-05-30 MED ORDER — OXYCODONE-ACETAMINOPHEN 5-325 MG PO TABS: 2.0000 | ORAL_TABLET | ORAL | Status: DC | PRN

## 2023-05-30 MED ORDER — SOD CITRATE-CITRIC ACID 500-334 MG/5ML PO SOLN: 30.0000 mL | ORAL | Status: DC | PRN

## 2023-05-30 MED ORDER — ACETAMINOPHEN 325 MG PO TABS: 650.0000 mg | ORAL_TABLET | ORAL | Status: DC | PRN

## 2023-05-30 MED ORDER — OXYCODONE-ACETAMINOPHEN 5-325 MG PO TABS: 1.0000 | ORAL_TABLET | ORAL | Status: DC | PRN

## 2023-05-30 MED ORDER — OXYTOCIN BOLUS FROM INFUSION
333.0000 mL | Freq: Once | INTRAVENOUS | Status: AC
  Administered 2023-05-31: 333 mL via INTRAVENOUS

## 2023-05-30 MED ORDER — LACTATED RINGERS IV SOLN: INTRAVENOUS | Status: DC

## 2023-05-30 MED ORDER — ONDANSETRON HCL 4 MG/2ML IJ SOLN: 4.0000 mg | Freq: Four times a day (QID) | INTRAMUSCULAR | Status: DC | PRN

## 2023-05-30 NOTE — MAU Note (Signed)
.  Kelsey Lara is a 32 y.o. at [redacted]w[redacted]d here in MAU reporting:   Contractions every: 4 minutes Onset of ctx: Today Pain score: 8/10  ROM: Possible ROM trickling since 9:30 pm Vaginal Bleeding: Bloody show Last SVE: 4 cm last Thursday   Epidural: Not planning  Fetal Movement: Reports positive FM FHT:150 via External  Vitals:   05/30/23 2250  BP: 110/83  Pulse: (!) 115  Resp: 20  Temp: 97.9 F (36.6 C)  SpO2: 100%       OB Office: Faculty GBS: Negative HSV: Denies hx of HSV Lab orders placed from triage: MAU Labor Eval

## 2023-05-30 NOTE — H&P (Signed)
LABOR AND DELIVERY ADMISSION HISTORY AND PHYSICAL NOTE  Kelsey Lara is a 32 y.o. female [phone] with IUP at [redacted]w[redacted]d presenting for SOL.   Patient reports the fetal movement as active. Patient reports uterine contraction  activity as regular, every 3 minutes. Patient reports  vaginal bleeding as none. Patient describes fluid per vagina as Clear.   Patient denies headache, vision changes, chest pain, shortness of breath, right upper quadrant pain, or LE edema.  She plans on breast feeding and bottle feeding feeding. Her contraception plan is:  undecided .  Prenatal History/Complications: PNC at [facility]:  @[redacted]w[redacted]d , CWD, normal anatomy, cephalic presentation, posterior placenta, 54%ile  Pregnancy complications:  Patient Active Problem List   Diagnosis Date Noted   Normal labor 05/30/2023   Fibroids 02/23/2023   Rubella non-immune status, antepartum 02/17/2023   Supervision of other normal pregnancy, antepartum 01/10/2023    Past Medical History: History reviewed. No pertinent past medical history.  Past Surgical History: History reviewed. No pertinent surgical history.  Obstetrical History: OB History     Gravida  4   Para  2   Term  2   Preterm      AB  1   Living  2      SAB  1   IAB      Ectopic      Multiple      Live Births  2           Social History: Social History   Socioeconomic History   Marital status: Single    Spouse name: Not on file   Number of children: Not on file   Years of education: Not on file   Highest education level: Not on file  Occupational History   Not on file  Tobacco Use   Smoking status: Never   Smokeless tobacco: Never  Vaping Use   Vaping status: Never Used  Substance and Sexual Activity   Alcohol use: No   Drug use: No   Sexual activity: Yes  Other Topics Concern   Not on file  Social History Narrative   Not on file   Social Determinants of Health   Financial Resource Strain: Not on file   Food Insecurity: No Food Insecurity (05/30/2023)   Hunger Vital Sign    Worried About Running Out of Food in the Last Year: Never true    Ran Out of Food in the Last Year: Never true  Transportation Needs: No Transportation Needs (05/30/2023)   PRAPARE - Administrator, Civil Service (Medical): No    Lack of Transportation (Non-Medical): No  Physical Activity: Not on file  Stress: Not on file  Social Connections: Not on file    Family History: Family History  Problem Relation Age of Onset   Hypertension Mother    Diabetes Mother     Allergies: No Known Allergies  Medications Prior to Admission  Medication Sig Dispense Refill Last Dose   Prenatal Vit-Fe Fumarate-FA (PRENATAL MULTIVITAMIN) TABS tablet Take 1 tablet by mouth daily at 12 noon.   Past Week     Review of Systems  All systems reviewed and negative except as stated in HPI  Physical Exam BP 110/83 (BP Location: Right Arm)   Pulse (!) 115   Temp 97.9 F (36.6 C) (Oral)   Resp 20   Ht 5\' 1"  (1.549 m)   Wt 81.5 kg   LMP 10/27/2022   SpO2 100%   BMI 33.94 kg/m  Physical Exam Constitutional:      General: She is not in acute distress.    Appearance: Normal appearance.  Cardiovascular:     Rate and Rhythm: Normal rate and regular rhythm.  Pulmonary:     Effort: Pulmonary effort is normal.  Abdominal:     Comments: Gravid  Musculoskeletal:        General: No swelling.  Neurological:     Mental Status: Mental status is at baseline.  Psychiatric:        Mood and Affect: Mood normal.      Presentation: cephalic by check  Fetal monitoring: Baseline: 150 bpm, Variability: Good {> 6 bpm), Accelerations: Reactive, and Decelerations: Absent Uterine activity: Q3 minutes  Dilation: 6 Effacement (%): 80 Station: -3 Exam by:: Anselmo Pickler, RN  Prenatal labs: ABO, Rh: --/--/PENDING (09/09 2319) Antibody: PENDING (09/09 2319) Rubella: <0.90 (04/24 1608) RPR: Non Reactive (06/24 0906)  HBsAg:  Negative (04/24 1608)  HIV: Non Reactive (06/24 0906)  GC/Chlamydia:  Neisseria Gonorrhea  Date Value Ref Range Status  05/10/2023 Negative  Final   Chlamydia  Date Value Ref Range Status  05/10/2023 Negative  Final   GBS: Negative/-- (08/20 1537)    Prenatal Transfer Tool  Maternal Diabetes: No Genetic Screening: Normal Maternal Ultrasounds/Referrals: Normal Fetal Ultrasounds or other Referrals:  None Maternal Substance Abuse:  No Significant Maternal Medications:  None Significant Maternal Lab Results: Group B Strep negative  Results for orders placed or performed during the hospital encounter of 05/30/23 (from the past 24 hour(s))  CBC   Collection Time: 05/30/23 11:19 PM  Result Value Ref Range   WBC 14.5 (H) 4.0 - 10.5 K/uL   RBC 4.11 3.87 - 5.11 MIL/uL   Hemoglobin 12.4 12.0 - 15.0 g/dL   HCT 40.9 81.1 - 91.4 %   MCV 92.0 80.0 - 100.0 fL   MCH 30.2 26.0 - 34.0 pg   MCHC 32.8 30.0 - 36.0 g/dL   RDW 78.2 95.6 - 21.3 %   Platelets 291 150 - 400 K/uL   nRBC 0.0 0.0 - 0.2 %  Type and screen [facility]   Collection Time: 05/30/23 11:19 PM  Result Value Ref Range   ABO/RH(D) PENDING    Antibody Screen PENDING    Sample Expiration      06/02/2023,2359 Performed at [facility], [address] [place], [place] [address]     Assessment: Kelsey Lara is a 32 y.o. Q4O9629 at [redacted]w[redacted]d here for SOL.  #Labor: Expectant management #Pain: IV pain meds PRN, epidural upon request #FHT: Category I #GBS/ID: Negative #MOF: breast feeding and bottle feeding #MOC:  undecided  Joanne Gavel, MD [facility] Fellow Center for [facility], [facility]  05/31/2023, 12:02 AM

## 2023-05-31 ENCOUNTER — Encounter (HOSPITAL_COMMUNITY): Payer: Self-pay | Admitting: Obstetrics and Gynecology

## 2023-05-31 DIAGNOSIS — Z3A39 39 weeks gestation of pregnancy: Secondary | ICD-10-CM | POA: Diagnosis not present

## 2023-05-31 LAB — TYPE AND SCREEN
ABO/RH(D): O POS
Antibody Screen: NEGATIVE

## 2023-05-31 LAB — RPR: RPR Ser Ql: NONREACTIVE

## 2023-05-31 MED ORDER — GUAIFENESIN ER 600 MG PO TB12
600.0000 mg | ORAL_TABLET | Freq: Two times a day (BID) | ORAL | Status: DC
  Administered 2023-05-31: 600 mg via ORAL
  Filled 2023-05-31: qty 1

## 2023-05-31 MED ORDER — MEASLES, MUMPS & RUBELLA VAC IJ SOLR
0.5000 mL | Freq: Once | INTRAMUSCULAR | Status: AC
  Administered 2023-06-01: 0.5 mL via SUBCUTANEOUS
  Filled 2023-05-31: qty 0.5

## 2023-05-31 MED ORDER — BENZOCAINE-MENTHOL 20-0.5 % EX AERO: 1.0000 | INHALATION_SPRAY | CUTANEOUS | Status: DC | PRN

## 2023-05-31 MED ORDER — WITCH HAZEL-GLYCERIN EX PADS: 1.0000 | MEDICATED_PAD | CUTANEOUS | Status: DC | PRN

## 2023-05-31 MED ORDER — ONDANSETRON HCL 4 MG/2ML IJ SOLN: 4.0000 mg | INTRAMUSCULAR | Status: DC | PRN

## 2023-05-31 MED ORDER — COCONUT OIL OIL: 1.0000 | TOPICAL_OIL | Status: DC | PRN

## 2023-05-31 MED ORDER — IBUPROFEN 600 MG PO TABS
600.0000 mg | ORAL_TABLET | Freq: Four times a day (QID) | ORAL | Status: DC
  Administered 2023-05-31 – 2023-06-01 (×3): 600 mg via ORAL
  Filled 2023-05-31 (×5): qty 1

## 2023-05-31 MED ORDER — DIBUCAINE (PERIANAL) 1 % EX OINT: 1.0000 | TOPICAL_OINTMENT | CUTANEOUS | Status: DC | PRN

## 2023-05-31 MED ORDER — SIMETHICONE 80 MG PO CHEW: 80.0000 mg | CHEWABLE_TABLET | ORAL | Status: DC | PRN

## 2023-05-31 MED ORDER — PRENATAL MULTIVITAMIN CH
1.0000 | ORAL_TABLET | Freq: Every day | ORAL | Status: DC
  Administered 2023-05-31 – 2023-06-01 (×2): 1 via ORAL
  Filled 2023-05-31 (×2): qty 1

## 2023-05-31 MED ORDER — GUAIFENESIN ER 600 MG PO TB12: 600.0000 mg | ORAL_TABLET | Freq: Two times a day (BID) | ORAL | Status: DC

## 2023-05-31 MED ORDER — GUAIFENESIN ER 600 MG PO TB12
1200.0000 mg | ORAL_TABLET | Freq: Two times a day (BID) | ORAL | Status: DC | PRN
  Filled 2023-05-31: qty 2

## 2023-05-31 MED ORDER — ONDANSETRON HCL 4 MG PO TABS: 4.0000 mg | ORAL_TABLET | ORAL | Status: DC | PRN

## 2023-05-31 MED ORDER — ACETAMINOPHEN 325 MG PO TABS: 650.0000 mg | ORAL_TABLET | ORAL | Status: DC | PRN

## 2023-05-31 MED ORDER — ZOLPIDEM TARTRATE 5 MG PO TABS: 5.0000 mg | ORAL_TABLET | Freq: Every evening | ORAL | Status: DC | PRN

## 2023-05-31 MED ORDER — GUAIFENESIN ER 600 MG PO TB12
1200.0000 mg | ORAL_TABLET | Freq: Two times a day (BID) | ORAL | Status: DC
  Administered 2023-06-01: 1200 mg via ORAL
  Filled 2023-05-31 (×4): qty 2

## 2023-05-31 MED ORDER — TETANUS-DIPHTH-ACELL PERTUSSIS 5-2.5-18.5 LF-MCG/0.5 IM SUSY: 0.5000 mL | PREFILLED_SYRINGE | Freq: Once | INTRAMUSCULAR | Status: DC

## 2023-05-31 MED ORDER — SENNOSIDES-DOCUSATE SODIUM 8.6-50 MG PO TABS
2.0000 | ORAL_TABLET | Freq: Every day | ORAL | Status: DC
  Administered 2023-06-01: 2 via ORAL
  Filled 2023-05-31: qty 2

## 2023-05-31 MED ORDER — DIPHENHYDRAMINE HCL 25 MG PO CAPS: 25.0000 mg | ORAL_CAPSULE | Freq: Four times a day (QID) | ORAL | Status: DC | PRN

## 2023-05-31 NOTE — Lactation Note (Signed)
This note was copied from a baby's chart. Lactation Consultation Note  Patient Name: Kelsey Lara [id number] Date: 05/31/2023 Age:34 hours  Reason for consult: Initial assessment;Term  P3, [redacted]w[redacted]d  Initial LC visit to see P3 mother of term infant at 33 hours old. Mother is breast and formula feeding by choice. Baby has breast fed 15 min and recently had 35 ml of formula by bottle.  Mother reports pumping for her other children for 1-2 weeks after birth. She says she wants to breastfeed more and longer with this baby. Discussed breastfeeding with infant feeding cues and reducing formula intake to 10-15 ml on the first day of life to promote breastfeeding.   Encouraged to latch baby with feeding cues, place baby skin to skin if not latching.  Call for assistance with breastfeeding, as needed.  Anticipate as baby approaches 24 hours of age, baby will feed more often at breast, 8-12 plus times in 24 hours and "cluster feed"      Mom made aware of O/P services, breastfeeding support groups, community resources, and our phone # for post-discharge questions.     Maternal Data Has patient been taught Hand Expression?: No Does the patient have breastfeeding experience prior to this delivery?: Yes How long did the patient breastfeed?: pumped for 1-2 weeks with old  Feeding Mother's Current Feeding Choice: Breast Milk and Formula  LATCH Score  Not observed  Interventions Interventions: Education;LC Services brochure (handout for milk collect, storage and preparation)  Discharge Pump: DEBP;Personal  Consult Status Consult Status: Follow-up Date: 06/01/23 Follow-up type: In-patient    [name] M 05/31/2023, 2:35 PM

## 2023-05-31 NOTE — Discharge Summary (Shared)
     Postpartum Discharge Summary  Date of Service updated***     Patient Name: Kelsey Lara DOB: 1991-07-13 MRN: [id number]  Date of admission: 05/30/2023 Delivery date:05/31/2023 Delivering provider: Joanne Gavel Date of discharge: 05/31/2023  Admitting diagnosis: Normal labor [O80, Z37.9] Intrauterine pregnancy: [redacted]w[redacted]d     Secondary diagnosis:  Principal Problem:   Normal labor Active Problems:   Supervision of other normal pregnancy, antepartum   Rubella non-immune status, antepartum   Fibroids  Additional problems: ***    Discharge diagnosis: Term Pregnancy Delivered                                              Post partum procedures:*** Augmentation:  none Complications: None  Hospital course: Onset of Labor With Vaginal Delivery      32 y.o. yo [id number] at [redacted]w[redacted]d was admitted in Active Labor on 05/30/2023. Labor course was complicated by none.  Membrane Rupture Time/Date: 2:21 AM,05/31/2023  Delivery Method:Vaginal, Spontaneous Operative Delivery:N/A Episiotomy: None Lacerations:  None Patient had a postpartum course complicated by ***.  She is ambulating, tolerating a regular diet, passing flatus, and urinating well. Patient is discharged home in stable condition on 05/31/23.  Newborn Data: Birth date:05/31/2023 Birth time:6:29 AM Gender:Female Living status:Living Apgars:5 ,7  Weight:3544 g  Magnesium Sulfate received: No BMZ received: No Rhophylac:No MMR:No T-DaP:Given prenatally Flu: N/A Transfusion:No  Physical exam  Vitals:   05/31/23 0403 05/31/23 0436 05/31/23 0503 05/31/23 0634  BP: (!) 104/55 111/71  (!) 111/47  Pulse: (!) 115 (!) 122  (!) 111  Resp:      Temp: 99.3 F (37.4 C)  99 F (37.2 C)   TempSrc: Axillary  Axillary   SpO2:      Weight:      Height:       General: {Exam; general:21111117} Lochia: {Desc; appropriate/inappropriate:30686::"appropriate"} Uterine Fundus: {Desc; firm/soft:30687} Incision: {Exam; incision:21111123} DVT  Evaluation: {Exam; dvt:2111122} Labs: Lab Results  Component Value Date   WBC 14.5 (H) 05/30/2023   HGB 12.4 05/30/2023   HCT 37.8 05/30/2023   MCV 92.0 05/30/2023   PLT 291 05/30/2023       No data to display         Edinburgh Score:     No data to display           After visit meds:  Allergies as of 05/31/2023   No Known Allergies   Med Rec must be completed prior to using this [facility]***        Discharge home in stable condition Infant Feeding: {Baby feeding:23562} Infant Disposition:{CHL IP OB HOME WITH GEXBMW:41324} Discharge instruction: per After Visit Summary and Postpartum booklet. Activity: Advance as tolerated. Pelvic rest for 6 weeks.  Diet: {OB MWNU:27253664} Future Appointments: Future Appointments  Date Time Provider Department Center  06/01/2023  8:30 AM Milas Hock, MD [facility]   Follow up Visit:  Message sent to [facility] 9/10  Please schedule this patient for a In person postpartum visit in 6 weeks with the following provider: Any provider. Additional Postpartum F/U: None   Low risk pregnancy complicated by:  none Delivery mode:  Vaginal, Spontaneous Anticipated Birth Control:  Unsure   05/31/2023 Joanne Gavel, MD

## 2023-05-31 NOTE — Progress Notes (Signed)
Kelsey Lara is a 32 y.o. [id number] at [redacted]w[redacted]d by ultrasound admitted for active labor  Subjective: Pt is feeling urges to push q 2 minutes. She is breathing through intense contractions, grasping handlebars during contractions.   Objective: BP 111/71   Pulse (!) 122   Temp 99 F (37.2 C) (Axillary)   Resp 20   Ht 5\' 1"  (1.549 m)   Wt 81.5 kg   LMP 10/27/2022   SpO2 100%   BMI 33.94 kg/m  No intake/output data recorded. No intake/output data recorded.  FHT:  FHR: 145 bpm, variability: moderate,  accelerations:  Abscent,  decelerations:  Absent UC:   regular, every 2 minutes SVE:   Dilation: 9 Effacement (%): 90 Station: -1, 0 Exam by:: Olivia Mackie RN  Labs: Lab Results  Component Value Date   WBC 14.5 (H) 05/30/2023   HGB 12.4 05/30/2023   HCT 37.8 05/30/2023   MCV 92.0 05/30/2023   PLT 291 05/30/2023    Assessment / Plan: Spontaneous labor, progressing normally  Labor: Progressing normally. Will not push until complete cervical dilation.  Fetal Wellbeing:  Category I Pain Control:  Labor support without medications I/D:  n/a Anticipated MOD:  NSVD  Landry Dyke, MD 05/31/2023, 6:08 AM

## 2023-06-01 ENCOUNTER — Encounter: Payer: Medicaid Other | Admitting: Obstetrics and Gynecology

## 2023-06-01 LAB — BIRTH TISSUE RECOVERY COLLECTION (PLACENTA DONATION)

## 2023-06-01 MED ORDER — ACETAMINOPHEN 325 MG PO TABS: 650.0000 mg | ORAL_TABLET | ORAL | Status: DC | PRN

## 2023-06-01 MED ORDER — SIMETHICONE 80 MG PO CHEW: 80.0000 mg | CHEWABLE_TABLET | ORAL | Status: DC | PRN

## 2023-06-01 MED ORDER — IBUPROFEN 600 MG PO TABS: 600.0000 mg | ORAL_TABLET | Freq: Four times a day (QID) | ORAL | 0 refills | Status: DC

## 2023-06-01 MED ORDER — NORETHINDRONE 0.35 MG PO TABS: 1.0000 | ORAL_TABLET | Freq: Every day | ORAL | 11 refills | Status: DC

## 2023-06-01 MED ORDER — COCONUT OIL OIL: 1.0000 | TOPICAL_OIL | Status: DC | PRN

## 2023-06-01 MED ORDER — SENNOSIDES-DOCUSATE SODIUM 8.6-50 MG PO TABS: 2.0000 | ORAL_TABLET | Freq: Every day | ORAL | Status: DC

## 2023-06-07 ENCOUNTER — Telehealth: Payer: Self-pay | Admitting: *Deleted

## 2023-06-07 NOTE — Telephone Encounter (Signed)
-----   Message from Joanne Gavel sent at 05/31/2023  7:17 AM EDT ----- Regarding: PP Visit Please schedule this patient for a In person postpartum visit in 6 weeks with the following provider: Any provider. Additional Postpartum F/U: None   Low risk pregnancy complicated by:  none Delivery mode: Vaginal, Spontaneous Anticipated Birth Control: Unsure

## 2023-06-07 NOTE — Telephone Encounter (Signed)
Left patient a message to call and schedule as soon as possible.

## 2023-06-21 DIAGNOSIS — Z419 Encounter for procedure for purposes other than remedying health state, unspecified: Secondary | ICD-10-CM | POA: Diagnosis not present

## 2023-06-30 ENCOUNTER — Telehealth (HOSPITAL_COMMUNITY): Payer: Self-pay | Admitting: *Deleted

## 2023-06-30 NOTE — Telephone Encounter (Signed)
06/30/2023  Name: Fallyn Munnerlyn MRN: [id number] DOB: Jul 16, 1991  Reason for Call:  Transition of Care Hospital Discharge Call  Contact Status: Patient Contact Status: Message  Language assistant needed:          Follow-Up Questions:    Inocente Salles Postnatal Depression Scale:  In the Past 7 Days:    PHQ2-9 Depression Scale:     Discharge Follow-up:    Post-discharge interventions: NA  Salena Saner, RN 06/30/2023 11:43

## 2023-07-12 ENCOUNTER — Other Ambulatory Visit (HOSPITAL_COMMUNITY)
Admission: RE | Admit: 2023-07-12 | Discharge: 2023-07-12 | Disposition: A | Discharge status: Home / Self Care | Payer: Medicaid Other | Source: Ambulatory Visit | Attending: Obstetrics and Gynecology | Admitting: Obstetrics and Gynecology

## 2023-07-12 ENCOUNTER — Ambulatory Visit: Payer: Medicaid Other | Admitting: Obstetrics and Gynecology

## 2023-07-12 VITALS — BP 130/74 | HR 91 | Ht 61.0 in | Wt 151.0 lb

## 2023-07-12 DIAGNOSIS — N898 Other specified noninflammatory disorders of vagina: Secondary | ICD-10-CM | POA: Diagnosis not present

## 2023-07-12 MED ORDER — NORGESTIMATE-ETH ESTRADIOL 0.25-35 MG-MCG PO TABS: 1.0000 | ORAL_TABLET | Freq: Every day | ORAL | 11 refills | Status: DC

## 2023-07-12 NOTE — Progress Notes (Signed)
Post Partum Visit Note  Kelsey Lara is a 32 y.o. [phone] female who presents for a postpartum visit. She is 6 weeks postpartum following a normal spontaneous vaginal delivery.  I have fully reviewed the prenatal and intrapartum course. The delivery was at 39.6 gestational weeks.  Anesthesia: none. Postpartum course has been unremarkable. Baby is doing well. Baby is feeding by bottle - Similac Advance. Bleeding no bleeding. Bowel function is normal. Bladder function is normal. Patient is not sexually active. Contraception method is OCP (estrogen/progesterone). Postpartum depression screening: negative.  Complains of an increase in vaginal discharge.  Has not been sexually active since delivery.   The pregnancy intention screening data noted above was reviewed. Potential methods of contraception were discussed. The patient elected to proceed with No data recorded.   Edinburgh Postnatal Depression Scale - 07/12/23 1500       Edinburgh Postnatal Depression Scale:  In the Past 7 Days   I have been able to laugh and see the funny side of things. 0    I have looked forward with enjoyment to things. 1    I have blamed myself unnecessarily when things went wrong. 0    I have been anxious or worried for no good reason. 2    I have felt scared or panicky for no good reason. 1    Things have been getting on top of me. 1    I have been so unhappy that I have had difficulty sleeping. 1    I have felt sad or miserable. 1    I have been so unhappy that I have been crying. 1    The thought of harming myself has occurred to me. 0    Edinburgh Postnatal Depression Scale Total 8             Health Maintenance Due  Topic Date Due   INFLUENZA VACCINE  Never done   COVID-19 Vaccine (1 - 2023-24 season) Never done    The following portions of the patient's history were reviewed and updated as appropriate: allergies, current medications, past family history, past medical history, past social history,  past surgical history, and problem list.  Review of Systems Pertinent items are noted in HPI.  Objective:  BP 130/74   Pulse 91   Ht 5\' 1"  (1.549 m)   Wt 151 lb (68.5 kg)   LMP 10/27/2022   BMI 28.53 kg/m    General:  alert and cooperative   Breasts:  not indicated       Assessment:    Normal postpartum exam.  Self swab today 2/2 complaints of vaginal discharge.   Plan:   Essential components of care per ACOG recommendations:  1.  Mood and well being: Patient with negative depression screening today. Reviewed local resources for support.  - Patient tobacco use? No.   - hx of drug use? No.    2. Infant care and feeding:  -Patient currently breastmilk feeding? No.  -Social determinants of health (SDOH) reviewed in EPIC. No concerns  3. Sexuality, contraception and birth spacing - Patient does not know want a pregnancy in the next year.  Desired family size is unsure children.  - Reviewed reproductive life planning. Reviewed contraceptive methods based on pt preferences and effectiveness.  Patient desired Oral Contraceptive today.   - Discussed birth spacing of 18 months  4. Sleep and fatigue -Encouraged family/partner/community support of 4 hrs of uninterrupted sleep to help with mood and fatigue  5. Physical  Recovery  - Discussed patients delivery and complications. She describes her labor as good. - Patient had a Vaginal, no problems at delivery. Patient had a  No  laceration. Perineal healing reviewed. Patient expressed understanding - Patient has urinary incontinence? No. - Patient is safe to resume physical and sexual activity  6.  Health Maintenance - HM due items addressed No -   - Last pap smear  Diagnosis  Date Value Ref Range Status  01/12/2023   Final   - Negative for intraepithelial lesion or malignancy (NILM)   Pap smear not done at today's visit.  -Breast Cancer screening indicated? No.   7. Chronic Disease/Pregnancy Condition follow up:  None  - PCP follow up  Venia Carbon, NP [facility] [employer], [facility]

## 2023-07-13 LAB — CERVICOVAGINAL ANCILLARY ONLY
Bacterial Vaginitis (gardnerella): POSITIVE — AB
Candida Glabrata: NEGATIVE
Candida Vaginitis: NEGATIVE
Comment: NEGATIVE
Comment: NEGATIVE
Comment: NEGATIVE

## 2023-07-15 ENCOUNTER — Other Ambulatory Visit: Payer: Self-pay | Admitting: Obstetrics and Gynecology

## 2023-07-15 MED ORDER — METRONIDAZOLE 500 MG PO TABS: 500.0000 mg | ORAL_TABLET | Freq: Two times a day (BID) | ORAL | 0 refills | Status: AC

## 2023-07-22 DIAGNOSIS — Z419 Encounter for procedure for purposes other than remedying health state, unspecified: Secondary | ICD-10-CM | POA: Diagnosis not present

## 2023-08-21 DIAGNOSIS — Z419 Encounter for procedure for purposes other than remedying health state, unspecified: Secondary | ICD-10-CM | POA: Diagnosis not present

## 2023-09-21 DIAGNOSIS — Z419 Encounter for procedure for purposes other than remedying health state, unspecified: Secondary | ICD-10-CM | POA: Diagnosis not present

## 2023-10-22 DIAGNOSIS — Z419 Encounter for procedure for purposes other than remedying health state, unspecified: Secondary | ICD-10-CM | POA: Diagnosis not present

## 2023-11-19 DIAGNOSIS — Z419 Encounter for procedure for purposes other than remedying health state, unspecified: Secondary | ICD-10-CM | POA: Diagnosis not present

## 2023-12-31 DIAGNOSIS — Z419 Encounter for procedure for purposes other than remedying health state, unspecified: Secondary | ICD-10-CM | POA: Diagnosis not present

## 2024-01-30 DIAGNOSIS — Z419 Encounter for procedure for purposes other than remedying health state, unspecified: Secondary | ICD-10-CM | POA: Diagnosis not present

## 2024-03-01 DIAGNOSIS — Z419 Encounter for procedure for purposes other than remedying health state, unspecified: Secondary | ICD-10-CM | POA: Diagnosis not present

## 2024-03-31 DIAGNOSIS — Z419 Encounter for procedure for purposes other than remedying health state, unspecified: Secondary | ICD-10-CM | POA: Diagnosis not present

## 2024-05-01 DIAGNOSIS — Z419 Encounter for procedure for purposes other than remedying health state, unspecified: Secondary | ICD-10-CM | POA: Diagnosis not present

## 2024-06-01 DIAGNOSIS — Z419 Encounter for procedure for purposes other than remedying health state, unspecified: Secondary | ICD-10-CM | POA: Diagnosis not present

## 2024-07-30 ENCOUNTER — Telehealth: Payer: Self-pay | Admitting: *Deleted

## 2024-07-30 NOTE — Telephone Encounter (Signed)
 Returned call from 3:58 PM. Could not hear first name on the voicemail and DOB was not left. But she is a patient here. Left patient a message to call and schedule.

## 2024-08-08 ENCOUNTER — Encounter: Payer: Self-pay | Admitting: Obstetrics and Gynecology

## 2024-08-08 ENCOUNTER — Ambulatory Visit (INDEPENDENT_AMBULATORY_CARE_PROVIDER_SITE_OTHER)

## 2024-08-08 ENCOUNTER — Other Ambulatory Visit (HOSPITAL_COMMUNITY)
Admission: RE | Admit: 2024-08-08 | Discharge: 2024-08-08 | Disposition: A | Discharge status: Home / Self Care | Source: Ambulatory Visit | Attending: Obstetrics and Gynecology | Admitting: Obstetrics and Gynecology

## 2024-08-08 VITALS — BP 116/86 | HR 82 | Ht 61.0 in | Wt 142.0 lb

## 2024-08-08 DIAGNOSIS — Z3A01 Less than 8 weeks gestation of pregnancy: Secondary | ICD-10-CM

## 2024-08-08 DIAGNOSIS — Z3201 Encounter for pregnancy test, result positive: Secondary | ICD-10-CM | POA: Insufficient documentation

## 2024-08-08 DIAGNOSIS — O09529 Supervision of elderly multigravida, unspecified trimester: Secondary | ICD-10-CM | POA: Insufficient documentation

## 2024-08-08 DIAGNOSIS — N926 Irregular menstruation, unspecified: Secondary | ICD-10-CM

## 2024-08-08 LAB — POCT URINE PREGNANCY: Preg Test, Ur: POSITIVE — AB

## 2024-08-08 NOTE — Progress Notes (Signed)
 Kelsey Lara here for a UPT. Pt had a positive upt at home. LMP is 06/19/2024.     UPT in office Positive.    Reviewed medications and informed to start a PNV, if not already. Pt to follow up in 4 weeks for New OB visit.    New OB Intake  I explained I am completing New OB Intake today. We discussed EDD of 03/26/2025, by Last Menstrual Period. Pt is [id number]. I reviewed her allergies, medications and Medical/Surgical/OB history.    Patient Active Problem List   Diagnosis Date Noted   Normal labor 05/30/2023   Fibroids 02/23/2023   Rubella non-immune status, antepartum 02/17/2023   Supervision of other normal pregnancy, antepartum 01/10/2023    Concerns addressed today  Delivery Plans Plans to deliver at [facility] Discussed the nature of our practice with multiple providers including residents and students. Due to the size of the practice, the delivering provider may not be the same as those providing prenatal care.   MyChart/Babyscripts MyChart access verified. I explained pt will have some visits in office and some virtually. Babyscripts app discussed.  Blood Pressure Cuff Blood pressure cuff discussed Discussed to be used for virtual visits and or if needed BP checks weekly.  Anatomy US  Explained first scheduled US  will be around 8-10 weeks for dating and viability, 18-20 weeks for anatomy.   Last Pap Diagnosis  Date Value Ref Range Status  01/12/2023   Final   - Negative for intraepithelial lesion or malignancy (NILM)    First visit review I reviewed new OB appt with patient. Explained pt will be seen by Dr. Cris at first visit. Discussed Kelsey Lara genetic screening. Routine prenatal labs to be completed at new OB appointment.    Silvano ORN [place], RN 08/08/2024  4:43 PM

## 2024-08-10 LAB — CERVICOVAGINAL ANCILLARY ONLY
Chlamydia: NEGATIVE
Comment: NEGATIVE
Comment: NORMAL
Neisseria Gonorrhea: NEGATIVE

## 2024-08-10 LAB — URINE CULTURE, OB REFLEX

## 2024-08-10 LAB — CULTURE, OB URINE

## 2024-08-23 ENCOUNTER — Other Ambulatory Visit: Payer: Self-pay

## 2024-08-23 ENCOUNTER — Ambulatory Visit

## 2024-08-23 DIAGNOSIS — Z3A01 Less than 8 weeks gestation of pregnancy: Secondary | ICD-10-CM

## 2024-08-23 DIAGNOSIS — Z3A09 9 weeks gestation of pregnancy: Secondary | ICD-10-CM | POA: Diagnosis not present

## 2024-08-23 DIAGNOSIS — Z3201 Encounter for pregnancy test, result positive: Secondary | ICD-10-CM

## 2024-08-23 DIAGNOSIS — O3680X0 Pregnancy with inconclusive fetal viability, not applicable or unspecified: Secondary | ICD-10-CM | POA: Diagnosis not present

## 2024-09-03 ENCOUNTER — Encounter: Payer: Self-pay | Admitting: Obstetrics & Gynecology

## 2024-09-03 ENCOUNTER — Ambulatory Visit: Admitting: Obstetrics & Gynecology

## 2024-09-03 VITALS — BP 128/85 | HR 80 | Wt 147.0 lb

## 2024-09-03 DIAGNOSIS — O09529 Supervision of elderly multigravida, unspecified trimester: Secondary | ICD-10-CM

## 2024-09-03 DIAGNOSIS — Z3A10 10 weeks gestation of pregnancy: Secondary | ICD-10-CM | POA: Diagnosis not present

## 2024-09-03 DIAGNOSIS — O09521 Supervision of elderly multigravida, first trimester: Secondary | ICD-10-CM | POA: Diagnosis not present

## 2024-09-03 DIAGNOSIS — Z3480 Encounter for supervision of other normal pregnancy, unspecified trimester: Secondary | ICD-10-CM | POA: Insufficient documentation

## 2024-09-03 MED ORDER — ONDANSETRON HCL 4 MG PO TABS: 4.0000 mg | ORAL_TABLET | Freq: Three times a day (TID) | ORAL | 0 refills | Status: AC | PRN

## 2024-09-03 MED ORDER — ASPIRIN 81 MG PO TBEC: 81.0000 mg | DELAYED_RELEASE_TABLET | Freq: Every day | ORAL | 12 refills | Status: AC

## 2024-09-03 NOTE — Progress Notes (Signed)
°  Subjective:    Kelsey Lara is a [id number] [redacted]w[redacted]d being seen today for her first obstetrical visit.  Her obstetrical history is significant for advanced maternal age. Patient does intend to breast feed. Pregnancy history fully reviewed--hx of fribroids--no probs with them.   Patient reports nausea.  Vitals:   09/03/24 0822  BP: 128/85  Pulse: 80  Weight: 147 lb (66.7 kg)    HISTORY: OB History  Gravida Para Term Preterm AB Living  5 3 3  1 3   SAB IAB Ectopic Multiple Live Births  1   0 3    # Outcome Date GA Lbr Len/2nd Weight Sex Type Anes PTL Lv  5 Current           4 Term 05/31/23 [redacted]w[redacted]d 18:57 / 00:02 7 lb 13 oz (3.544 kg) F Vag-Spont None  LIV  3 SAB 2020          2 Term 03/15/14 [redacted]w[redacted]d 08:59 8 lb 5 oz (3.771 kg) F Vag-Spont None  LIV  1 Term 09/26/07 [redacted]w[redacted]d 06:00 7 lb 5 oz (3.317 kg) F Vag-Spont   LIV   History reviewed. No pertinent past medical history. History reviewed. No pertinent surgical history. Family History  Problem Relation Age of Onset   Hypertension Mother    Diabetes Mother      Exam   Pt has infant with her (having to hold infant due to crying) and unable to do exam.  Not undressed.    Assessment:    Pregnancy: [id number] Patient Active Problem List   Diagnosis Date Noted   Supervision of other normal pregnancy, antepartum 09/03/2024   AMA (advanced maternal age) multigravida 35+ 08/08/2024   Fibroids 02/23/2023        Plan:  Initial labs drawn. Prenatal vitamins. Problem list reviewed and updated. Genetic Screening discussed:  Panorama then AFP at next visit Ultrasound discussed; fetal survey: order today for 18-20 weeks Follow up in 9 weeks (Baby Rx) AMA--TSH, CMP; Hgb A1Cstart asa 81 mg at 13 weeks Nausea--zofran  ordered; start docusate sodium  to prevent constipation.   Fibroids--pt has no problems--wilil remeasaure at anatomy US  Pap up todate Pt has baby with her and holding during visit--unable to do exam at today's visit.  RTC 9  weeks   Burnard Pate 09/03/2024

## 2024-09-04 LAB — CBC/D/PLT+RPR+RH+ABO+RUBIGG...
Antibody Screen: NEGATIVE
Basophils Absolute: 0.1 x10E3/uL (ref 0.0–0.2)
Basos: 1 %
EOS (ABSOLUTE): 0.2 x10E3/uL (ref 0.0–0.4)
Eos: 3 %
HCV Ab: NONREACTIVE
HIV Screen 4th Generation wRfx: NONREACTIVE
Hematocrit: 40.4 % (ref 34.0–46.6)
Hemoglobin: 13 g/dL (ref 11.1–15.9)
Hepatitis B Surface Ag: NEGATIVE
Immature Grans (Abs): 0.1 x10E3/uL (ref 0.0–0.1)
Immature Granulocytes: 1 %
Lymphocytes Absolute: 1.6 x10E3/uL (ref 0.7–3.1)
Lymphs: 18 %
MCH: 30.6 pg (ref 26.6–33.0)
MCHC: 32.2 g/dL (ref 31.5–35.7)
MCV: 95 fL (ref 79–97)
Monocytes Absolute: 0.7 x10E3/uL (ref 0.1–0.9)
Monocytes: 8 %
Neutrophils Absolute: 6.4 x10E3/uL (ref 1.4–7.0)
Neutrophils: 69 %
Platelets: 330 x10E3/uL (ref 150–450)
RBC: 4.25 x10E6/uL (ref 3.77–5.28)
RDW: 12.4 % (ref 11.7–15.4)
RPR Ser Ql: NONREACTIVE
Rh Factor: POSITIVE
Rubella Antibodies, IGG: 2 {index} (ref >0.99)
WBC: 9.1 x10E3/uL (ref 3.4–10.8)

## 2024-09-04 LAB — COMPREHENSIVE METABOLIC PANEL WITH GFR
ALT: 13 IU/L (ref 0–32)
AST: 13 IU/L (ref 0–40)
Albumin: 3.9 g/dL (ref 3.9–4.9)
Alkaline Phosphatase: 112 IU/L (ref 41–116)
BUN/Creatinine Ratio: 18 (ref 9–23)
BUN: 10 mg/dL (ref 6–20)
Bilirubin Total: 0.3 mg/dL (ref 0.0–1.2)
CO2: 20 mmol/L (ref 20–29)
Calcium: 9.3 mg/dL (ref 8.7–10.2)
Chloride: 101 mmol/L (ref 96–106)
Creatinine, Ser: 0.55 mg/dL — ABNORMAL LOW (ref 0.57–1.00)
Globulin, Total: 3.1 g/dL (ref 1.5–4.5)
Glucose: 75 mg/dL (ref 70–99)
Potassium: 3.7 mmol/L (ref 3.5–5.2)
Sodium: 135 mmol/L (ref 134–144)
Total Protein: 7 g/dL (ref 6.0–8.5)
eGFR: 123 mL/min/1.73 (ref >59)

## 2024-09-04 LAB — HEMOGLOBIN A1C
Est. average glucose Bld gHb Est-mCnc: 100 mg/dL
Hgb A1c MFr Bld: 5.1 % (ref 4.8–5.6)

## 2024-09-04 LAB — HCV INTERPRETATION

## 2024-09-04 LAB — TSH: TSH: 0.638 u[IU]/mL (ref 0.450–4.500)

## 2024-09-18 LAB — PANORAMA PRENATAL TEST FULL PANEL:PANORAMA TEST PLUS 5 ADDITIONAL MICRODELETIONS: FETAL FRACTION: 5.7

## 2024-09-19 ENCOUNTER — Other Ambulatory Visit: Payer: Self-pay

## 2024-09-19 ENCOUNTER — Ambulatory Visit: Payer: Self-pay | Admitting: Obstetrics & Gynecology

## 2024-09-19 DIAGNOSIS — R6889 Other general symptoms and signs: Secondary | ICD-10-CM

## 2024-10-04 ENCOUNTER — Ambulatory Visit

## 2024-10-04 DIAGNOSIS — O09522 Supervision of elderly multigravida, second trimester: Secondary | ICD-10-CM

## 2024-10-04 DIAGNOSIS — Z3A15 15 weeks gestation of pregnancy: Secondary | ICD-10-CM

## 2024-10-04 DIAGNOSIS — O285 Abnormal chromosomal and genetic finding on antenatal screening of mother: Secondary | ICD-10-CM | POA: Diagnosis not present

## 2024-10-04 NOTE — Progress Notes (Signed)
 "    [facility] at [facility] [address], [address] Phone:  [phone]   Fax:  [phone]      Virtual Visit via Video Note  I connected with  Kelsey Lara on 10/04/24 at  1:00 PM EST by a video enabled telemedicine application and verified that I am speaking with the correct person using two identifiers.  Location: Patient: Kelsey Lara. Provider: Maternal Fetal Care.   I discussed the limitations of evaluation and management by telemedicine and the availability of in person appointments. The patient expressed understanding and agreed to proceed.  Genetic Counseling Clinic Note:   I spoke with 34 y.o. Kelsey Lara today to discuss her NIPS results. She was referred by Cris Burnard DEL, MD.   Pregnancy History:    [id number]. EGA: [redacted]w[redacted]d by LMP. EDD: 03/26/2025. Kelsey Lara has three healthy daughters. She had one SAB of unknown etiology. Denies major personal health concerns. Denies bleeding, infections, and fevers in this pregnancy. Denies using tobacco, alcohol, or street drugs in this pregnancy.   Family History:    A three-generation pedigree was created and scanned into Epic under the Media tab.  Patient ethnicity reported as Asian and FOB ethnicity reported as Hispanic. Denies Ashkenazi Jewish ancestry.  Family history not remarkable for consanguinity, individuals with birth defects, intellectual disability, autism spectrum disorder, multiple spontaneous abortions, still births, or unexplained neonatal death.   Atypical Finding on X Chromosome on NIPS:  Non-invasive prenatal screening (NIPS) analyzes cell-free DNA originating from the placenta that is found in the maternal blood circulation during pregnancy to provide information regarding the presence or absence of extra DNA for chromosomes 13, 18, and 21 as well as the sex chromosomes. 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 The lab reports fetal sex is female. Fetal risk assessment for monosomy X could not be performed. 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 The results were low risk for trisomy 21, trisomy 18, trisomy 42, triploidy, and 22q11.2 deletion syndrome. Please see report for residual risk information.  We reviewed NIPS methodology and the results. Mosaicism is defined when an individual carries an atypical cell line with the chromosome difference and a normal cell line with the typical set of chromosomes. The finding can be due to normal variation or confined placental mosaicism. We discussed these findings and how they may also involve the whole or partial X chromosome.   Possible syndromes associated with abnormal X chromosomes are Turner syndrome (monosomy X) and triple X syndrome (trisomy X). Other possible conditions include copy number variants involving the X chromosome. This is not a complete list of all possible syndromes. We reviewed these conditions and discussed the other possible causes listed above. Due to the limited information obtained through NIPS, we offered amniocentesis for prenatal diagnosis. We discussed the technical aspects, benefits, risks, and limitations of amniocentesis including the 1/500 risk for miscarriage. Alternatively, we discussed that diagnostic testing can be completed postnatally. Kelsey Lara may decide on amniocentesis depending on the anatomy scan findings. We reviewed normal ultrasounds do not guarantee a healthy pregnancy.  Advanced Maternal Age:  We briefly discussed that the chance that a fetus would be affected with a chromosome difference increases with advanced maternal age. Kelsey Lara's current age-related risk to have a pregnancy affected with a chromosome difference is approximately 1 in 179 (~0.6%).  Previous Testing Completed:  Negative carrier screening:  Kelsey Lara previously completed [employer] carrier screening. She screened to not be a carrier  for cystic fibrosis (CF), spinal muscular atrophy (SMA), alpha thalassemia, and beta hemoglobinopathies. Please see report for residual risk information. A negative result on carrier screening reduces but does not eliminate the chance of being a carrier.    Plan of Care:   Anatomy ultrasound at [facility] on 10/31/2024. Patient will then decide on amniocentesis. If amniocentesis is declined, postnatal referral of newborn to pediatric genetics. [facility] list.   Informed consent was obtained. All questions were answered.   55 minutes were spent on the date of the encounter in service to the patient including preparation, consultation through video chat, discussion of test reports and available next steps, pedigree construction, genetic risk assessment, documentation, and care coordination.    Thank you for sharing in the care of Kelsey Lara with us .  Please do not hesitate to contact us  at [phone] if you have any questions.   Lauraine Bodily, MS, [facility] Certified Genetic Counselor   Genetic counseling student involved in appointment: No. "

## 2024-10-31 ENCOUNTER — Other Ambulatory Visit

## 2024-10-31 ENCOUNTER — Ambulatory Visit

## 2024-11-06 ENCOUNTER — Encounter: Admitting: Obstetrics and Gynecology
